# Patient Record
Sex: Male | Born: 1960 | Race: White | Hispanic: No | Marital: Married | Smoking: Never smoker
Health system: Southern US, Community
[De-identification: ages and names within clinical notes are randomized; demographics above are authoritative.]

## PROBLEM LIST (undated history)

## (undated) DIAGNOSIS — F32A Depression, unspecified: Secondary | ICD-10-CM

## (undated) DIAGNOSIS — R0609 Other forms of dyspnea: Secondary | ICD-10-CM

## (undated) DIAGNOSIS — R0989 Other specified symptoms and signs involving the circulatory and respiratory systems: Secondary | ICD-10-CM

## (undated) DIAGNOSIS — F329 Major depressive disorder, single episode, unspecified: Secondary | ICD-10-CM

## (undated) DIAGNOSIS — K219 Gastro-esophageal reflux disease without esophagitis: Secondary | ICD-10-CM

## (undated) HISTORY — PX: OTHER SURGICAL HISTORY: SHX169

## (undated) HISTORY — DX: Gastro-esophageal reflux disease without esophagitis: K21.9

## (undated) HISTORY — DX: Other specified symptoms and signs involving the circulatory and respiratory systems: R09.89

## (undated) HISTORY — PX: VASECTOMY: SHX75

## (undated) HISTORY — DX: Depression, unspecified: F32.A

## (undated) HISTORY — DX: Other specified symptoms and signs involving the circulatory and respiratory systems: R06.09

## (undated) HISTORY — DX: Major depressive disorder, single episode, unspecified: F32.9

---

## 2003-03-11 ENCOUNTER — Ambulatory Visit (HOSPITAL_BASED_OUTPATIENT_CLINIC_OR_DEPARTMENT_OTHER): Admission: RE | Admit: 2003-03-11 | Discharge: 2003-03-11 | Payer: Self-pay | Admitting: Emergency Medicine

## 2011-04-16 ENCOUNTER — Telehealth: Payer: Self-pay

## 2011-04-16 NOTE — Telephone Encounter (Signed)
Talked with pt and told him he is due for re-check. He had not planned to RTC until July, but will come in sooner if Dr Cleta Alberts needs him to. Dr Cleta Alberts, do you want pt to RTC bf RFing Concerta and Ritalin, or want to just give him 1 mos? Please advise

## 2011-04-16 NOTE — Telephone Encounter (Signed)
.  [facility]     PT REQUESTING REFILL ON BOTH [facility]    BEST PHONE [phone]

## 2011-04-17 MED ORDER — METHYLPHENIDATE HCL 10 MG PO TABS: 10.0000 mg | ORAL_TABLET | Freq: Two times a day (BID) | ORAL | Status: AC

## 2011-04-17 MED ORDER — METHYLPHENIDATE HCL ER (OSM) 36 MG PO TBCR: 36.0000 mg | EXTENDED_RELEASE_TABLET | ORAL | Status: AC

## 2011-04-17 NOTE — Telephone Encounter (Signed)
Rxs printed , ready for pick up

## 2011-04-17 NOTE — Telephone Encounter (Signed)
[facility] RXS ready to pick up

## 2011-04-17 NOTE — Telephone Encounter (Signed)
See Dr Ellis Parents OK for 2 more mos

## 2011-04-17 NOTE — Telephone Encounter (Signed)
Okay to give patient 2 months of medication . The patient can see me at 104/102 to give further medications.

## 2011-07-11 ENCOUNTER — Ambulatory Visit (INDEPENDENT_AMBULATORY_CARE_PROVIDER_SITE_OTHER): Payer: Managed Care, Other (non HMO) | Admitting: Family Medicine

## 2011-07-11 VITALS — BP 120/72 | HR 52 | Temp 97.7°F | Resp 18 | Ht 69.0 in | Wt 213.0 lb

## 2011-07-11 DIAGNOSIS — R05 Cough: Secondary | ICD-10-CM

## 2011-07-11 DIAGNOSIS — R0609 Other forms of dyspnea: Secondary | ICD-10-CM

## 2011-07-11 DIAGNOSIS — R0600 Dyspnea, unspecified: Secondary | ICD-10-CM

## 2011-07-11 DIAGNOSIS — R059 Cough, unspecified: Secondary | ICD-10-CM

## 2011-07-11 MED ORDER — ALBUTEROL SULFATE (2.5 MG/3ML) 0.083% IN NEBU: 2.5000 mg | INHALATION_SOLUTION | Freq: Four times a day (QID) | RESPIRATORY_TRACT | Status: DC | PRN

## 2011-07-11 MED ORDER — ALBUTEROL SULFATE HFA 108 (90 BASE) MCG/ACT IN AERS: 2.0000 | INHALATION_SPRAY | Freq: Four times a day (QID) | RESPIRATORY_TRACT | Status: DC | PRN

## 2011-07-11 NOTE — Patient Instructions (Addendum)
No restrictions at this time.  Use the albuterol inhaler 2 inhalations prior to physical activity.   We will let you know when the stress test is scheduled for.

## 2011-07-11 NOTE — Progress Notes (Signed)
Subjective: 51 year old male with history of working out by bicycle riding and running. He's been doing that for a long time. He now notices that when he works out hard, especially with running, he has nonproductive cough develops. He doesn't feel like he moves air quite well. There is just a little tight sensation when he breathes. He gets his with a running that he does with his bicycling. He does not smoke. He has no history of asthma. He has no history of heart disease. He gets regular physical exam from dr. Cleta Alberts. Lipids are good except for a borderline LDL.  Objective: No acute distress. Throat clear. Neck supple without nodes. Chest clear. Heart regular without murmurs. Abdomen benign.  Assessment: Cough and chest tightness with with exercise  Plan: EKG normal Peak flow test was normal with a predicted 585. He did between 575 and 600. Will give trial of albuterol. Also will make referral for a stress test.  Advise loss of a few pounds.

## 2011-07-30 ENCOUNTER — Encounter: Payer: Self-pay | Admitting: Family Medicine

## 2011-08-09 ENCOUNTER — Ambulatory Visit: Payer: Self-pay | Admitting: Cardiology

## 2011-09-18 ENCOUNTER — Encounter: Payer: Self-pay | Admitting: Cardiology

## 2011-09-18 ENCOUNTER — Ambulatory Visit (INDEPENDENT_AMBULATORY_CARE_PROVIDER_SITE_OTHER): Payer: Managed Care, Other (non HMO) | Admitting: Cardiology

## 2011-09-18 VITALS — BP 118/80 | HR 52 | Ht 70.0 in | Wt 215.0 lb

## 2011-09-18 DIAGNOSIS — R0989 Other specified symptoms and signs involving the circulatory and respiratory systems: Secondary | ICD-10-CM

## 2011-09-18 LAB — BRAIN NATRIURETIC PEPTIDE: Pro B Natriuretic peptide (BNP): 12 pg/mL (ref 0.0–100.0)

## 2011-09-18 NOTE — Progress Notes (Signed)
HPI The patient presents for evaluation of chest discomfort. He has no prior cardiac history though there is somewhat of a family history of early heart disease. He has been noticing chest tightness off and on daily for some time. It is getting more noticeable. He feels his heart beating strong with this and when he lays down at night. He thinks it might skip but this is not a predominant complaint. He's not having any presyncope or syncope. He says the chest tightness is just there and not made worse by activity. He doesn't do anything to try to take it away but works through it. He does exercise and can ride a bike.  He might get a little more short of breath he starts exercising but he works through this and any chest tightness that he might have. He doesn't have any radiation to his jaw or to his arms. He doesn't describe any PND or orthopnea. He does have a stress at work. For  No Known Allergies  Current Outpatient Prescriptions  Medication Sig Dispense Refill  . albuterol (PROVENTIL HFA;VENTOLIN HFA) 108 (90 BASE) MCG/ACT inhaler Inhale 2 puffs into the lungs every 6 (six) hours as needed for wheezing.  1 Inhaler  2    Past Medical History  Diagnosis Date  . Other dyspnea and respiratory abnormality   . Cough   . Chest tightness     No past surgical history on file.  No family history on file.  History   Social History  . Marital Status: Married    Spouse Name: N/A    Number of Children: N/A  . Years of Education: N/A   Occupational History  . Not on file.   Social History Main Topics  . Smoking status: Former Games developer  . Smokeless tobacco: Not on file  . Alcohol Use: Not on file  . Drug Use: Not on file  . Sexually Active: Not on file   Other Topics Concern  . Not on file   Social History Narrative  . No narrative on file    ROS:  As stated in the HPI and negative for all other systems.  PHYSICAL EXAM BP 118/80  Pulse 52  Ht 5\' 10"  (1.778 m)  Wt 215 lb  (97.523 kg)  BMI 30.85 kg/m2 GENERAL:  Well appearing HEENT:  Pupils equal round and reactive, fundi not visualized, oral mucosa unremarkable NECK:  No jugular venous distention, waveform within normal limits, carotid upstroke brisk and symmetric, no bruits, no thyromegaly LYMPHATICS:  No cervical, inguinal adenopathy LUNGS:  Clear to auscultation bilaterally BACK:  No CVA tenderness CHEST:  Unremarkable HEART:  PMI not displaced or sustained,S1 and S2 within normal limits, no S3, no S4, no clicks, no rubs, no murmurs ABD:  Flat, positive bowel sounds normal in frequency in pitch, no bruits, no rebound, no guarding, no midline pulsatile mass, no hepatomegaly, no splenomegaly EXT:  2 plus pulses throughout, no edema, no cyanosis no clubbing SKIN:  No rashes no nodules NEURO:  Cranial nerves II through XII grossly intact, motor grossly intact throughout [facility]:  Cognitively intact, oriented to person place and time   EKG:  Sinus bradycardia, rate 38, axis within normal limits, intervals within normal limits, no acute ST-T wave changes 07/11/11  ASSESSMENT AND PLAN  Chest pain -  I will bring the patient back for a POET (Plain Old Exercise Test). This will allow me to screen for obstructive coronary disease, risk stratify and very importantly provide a prescription  for exercise.  Bradycardia - To evaluate his bradycardia and the sensation of a strong heartbeat at night he'll have a 48-hour Holter monitor. I'll also check a BNP level as there is some dyspnea with exertion. However, he is a stress test monitor and BNP are normal no further cardiovascular testing will be plan.  Anxiety - He was tearful in the office today which is not apparently his baseline. However, we had a long talk about a llack of joy that he perceives and other depressive symptoms.  I will refer him to our Behavioral Health division.  I will also discuss with DAUB, Stan Head, MD

## 2011-09-18 NOTE — Patient Instructions (Addendum)
The current medical regimen is effective;  continue present plan and medications.  Your physician has requested that you have an exercise tolerance test. For further information please visit [email or web address] Please also follow instruction sheet, as given.  Your physician has recommended that you wear a holter monitor. Holter monitors are medical devices that record the heart's electrical activity. Doctors most often use these monitors to diagnose arrhythmias. Arrhythmias are problems with the speed or rhythm of the heartbeat. The monitor is a small, portable device. You can wear one while you do your normal daily activities. This is usually used to diagnose what is causing palpitations/syncope (passing out).  You will be contacted by Dr Teresita Madura office.

## 2011-09-20 ENCOUNTER — Ambulatory Visit (INDEPENDENT_AMBULATORY_CARE_PROVIDER_SITE_OTHER): Payer: Managed Care, Other (non HMO) | Admitting: Psychology

## 2011-09-20 DIAGNOSIS — F331 Major depressive disorder, recurrent, moderate: Secondary | ICD-10-CM

## 2011-09-27 ENCOUNTER — Ambulatory Visit (INDEPENDENT_AMBULATORY_CARE_PROVIDER_SITE_OTHER): Payer: Managed Care, Other (non HMO) | Admitting: Psychology

## 2011-09-27 ENCOUNTER — Ambulatory Visit: Payer: Managed Care, Other (non HMO) | Admitting: Psychology

## 2011-09-27 DIAGNOSIS — F4323 Adjustment disorder with mixed anxiety and depressed mood: Secondary | ICD-10-CM

## 2011-09-28 ENCOUNTER — Ambulatory Visit (INDEPENDENT_AMBULATORY_CARE_PROVIDER_SITE_OTHER): Payer: Managed Care, Other (non HMO) | Admitting: Emergency Medicine

## 2011-09-28 VITALS — BP 126/86 | HR 51 | Temp 97.8°F | Resp 16 | Ht 70.5 in | Wt 211.0 lb

## 2011-09-28 DIAGNOSIS — F329 Major depressive disorder, single episode, unspecified: Secondary | ICD-10-CM

## 2011-09-28 MED ORDER — ESCITALOPRAM OXALATE 10 MG PO TABS: 10.0000 mg | ORAL_TABLET | Freq: Every day | ORAL | Status: DC

## 2011-09-28 NOTE — Progress Notes (Signed)
  Subjective:    Patient ID: Matthew Henson, male    DOB: 07/04/1960, 51 y.o.   MRN: [id number]  HPI patient presents for followup. He has been thoroughly evaluated by the cardiologist and found not to have any evidence of heart disease. He went to the psychologist for evaluation of depression and she thought he was significantly depressed. He is here to get some help for this problem. He states he exercises regular he does not use drugs or drink. His wife has been concerned about him and their relationship has been difficult due to his depression. He has 2 brothers who are in good health. He lost his job about a week ago and feels his depression had a lot to do with that.    Review of Systems     Objective:   Physical Exam Patient is tearful but in no acute distress. Neck is supple. Chest is clear to auscultation and percussion. Heart is regular rate no murmurs abdomen soft nontender extremities without edema.       Assessment & Plan:  Patient here with  significant depression. We'll try Lexapro 10 one a day with the possibility of adding Wellbutrin in the future. we'll recheck in one month and see how he is doing .

## 2011-10-04 ENCOUNTER — Encounter: Payer: Managed Care, Other (non HMO) | Admitting: Physician Assistant

## 2011-10-09 ENCOUNTER — Encounter: Payer: Self-pay | Admitting: Emergency Medicine

## 2011-10-09 ENCOUNTER — Ambulatory Visit (INDEPENDENT_AMBULATORY_CARE_PROVIDER_SITE_OTHER): Payer: Managed Care, Other (non HMO) | Admitting: Emergency Medicine

## 2011-10-09 VITALS — BP 116/82 | HR 52 | Temp 98.5°F | Resp 16 | Ht 69.0 in | Wt 212.0 lb

## 2011-10-09 DIAGNOSIS — R05 Cough: Secondary | ICD-10-CM

## 2011-10-09 DIAGNOSIS — Z139 Encounter for screening, unspecified: Secondary | ICD-10-CM

## 2011-10-09 DIAGNOSIS — R0989 Other specified symptoms and signs involving the circulatory and respiratory systems: Secondary | ICD-10-CM

## 2011-10-09 DIAGNOSIS — F329 Major depressive disorder, single episode, unspecified: Secondary | ICD-10-CM

## 2011-10-09 DIAGNOSIS — J45990 Exercise induced bronchospasm: Secondary | ICD-10-CM

## 2011-10-09 LAB — IFOBT (OCCULT BLOOD): IFOBT: NEGATIVE

## 2011-10-09 MED ORDER — ALBUTEROL SULFATE HFA 108 (90 BASE) MCG/ACT IN AERS: 2.0000 | INHALATION_SPRAY | Freq: Four times a day (QID) | RESPIRATORY_TRACT | Status: AC | PRN

## 2011-10-09 MED ORDER — ESCITALOPRAM OXALATE 10 MG PO TABS: 10.0000 mg | ORAL_TABLET | Freq: Every day | ORAL | Status: DC

## 2011-10-09 NOTE — Progress Notes (Signed)
  Subjective:    Patient ID: Matthew Henson, male    DOB: 09-29-1960, 51 y.o.   MRN: [id number]  HPI    Review of Systems  Constitutional: Negative.   HENT: Negative.   Eyes: Negative.   Respiratory: Negative.   Cardiovascular: Negative.   Gastrointestinal: Negative.   Genitourinary: Negative.   Musculoskeletal: Negative.   Skin: Negative.   Neurological: Negative.   Hematological: Negative.   Psychiatric/Behavioral: Negative.        Objective:   Physical Exam HEENT exam is unremarkable. His neck is supple without carotid bruits. Thyroid is not enlarged chest is clear to auscultation and percussion. Heart is regular rate and rhythm without murmurs rubs or gallops. Abdomen is soft liver and spleen are not enlarged there are no areas of tenderness. GU reveals a normal male testicles descended no hernias palpable. Rectal exam reveals normal prostate no rectal masses palpable  Results for orders placed in visit on 10/09/11  IFOBT (OCCULT BLOOD)      Component Value Range   IFOBT Negative          Assessment & Plan:  Patient has responded very well starting on Lexapro. He is starting a new job  the end of this month. He is agreeable to come in the first of the year once his insurance takes effect and have more comprehensive blood work done. His cardiac workup was negative for heart disease.

## 2011-10-11 ENCOUNTER — Encounter: Payer: Self-pay | Admitting: Family Medicine

## 2011-11-10 ENCOUNTER — Telehealth: Payer: Self-pay

## 2011-11-10 NOTE — Telephone Encounter (Signed)
Pt is requesting an rx to be called in for generic cipro Pharmacy: [facility]

## 2011-11-10 NOTE — Telephone Encounter (Signed)
Called patient to find out why he was requesting Cipro. He states he is having a flare of diverticulitis. Hasnt had a flare in about 3 years. Was here just a couple weeks ago for physical with Dr. Cleta Alberts. He is having lower L abd pain and he knows this is a diverticulitis flare. He is going out of town tomorrow. Please advise.

## 2011-11-11 ENCOUNTER — Telehealth: Payer: Self-pay

## 2011-11-11 NOTE — Telephone Encounter (Signed)
PATIENT CALLED ABOUT RX REFILL ON CIPRO AND IF IT WAS REFILLED. IT STILL NEEDS APPROVAL FROM A PHYSICIAN. PLEASE CALL BACK IMMEDIATELY PATIENT NEEDS TO GO OUT OF TOWN TODAY? FOR WORK. THANK YOU!

## 2011-11-11 NOTE — Telephone Encounter (Signed)
Called patient to advise we can not do this without office visit.

## 2011-11-11 NOTE — Telephone Encounter (Signed)
No, not appropriate to fill Cipro without an office visit,patient advised.

## 2011-11-11 NOTE — Telephone Encounter (Signed)
Needs ov

## 2011-11-15 ENCOUNTER — Ambulatory Visit (INDEPENDENT_AMBULATORY_CARE_PROVIDER_SITE_OTHER): Payer: Managed Care, Other (non HMO) | Admitting: Emergency Medicine

## 2011-11-15 VITALS — BP 126/86 | HR 98 | Temp 98.0°F | Resp 16 | Ht 70.5 in | Wt 208.8 lb

## 2011-11-15 DIAGNOSIS — K5792 Diverticulitis of intestine, part unspecified, without perforation or abscess without bleeding: Secondary | ICD-10-CM

## 2011-11-15 DIAGNOSIS — K5732 Diverticulitis of large intestine without perforation or abscess without bleeding: Secondary | ICD-10-CM

## 2011-11-15 MED ORDER — AMOXICILLIN-POT CLAVULANATE ER 1000-62.5 MG PO TB12: 2.0000 | ORAL_TABLET | Freq: Two times a day (BID) | ORAL | Status: DC

## 2011-11-15 MED ORDER — METRONIDAZOLE 500 MG PO TABS: 500.0000 mg | ORAL_TABLET | Freq: Two times a day (BID) | ORAL | Status: DC

## 2011-11-15 MED ORDER — SULFAMETHOXAZOLE-TRIMETHOPRIM 800-160 MG PO TABS: 1.0000 | ORAL_TABLET | Freq: Two times a day (BID) | ORAL | Status: DC

## 2011-11-15 NOTE — Progress Notes (Signed)
[facility] [address] [place] [address] [phone]  Date:  11/15/2011   Name:  Matthew Henson   DOB:  August 11, 1960   MRN:  [id number]  PCP:  Lucilla Edin, MD    Chief Complaint: Diverticulitis   History of Present Illness:  Matthew Henson is a 51 y.o. very pleasant male patient who presents with the following:  Third episode of diverticulitis in patient with diverticulosis.  Twice diagnosed by CT.  Has LLQ pain intermittently for past two weeks.  Decreased fiber and increased fluids in diet and persists.  Had fever two nights ago.  No nausea or vomiting, diarrhea or stool change.  No blood in stool or black stools.  Was out of town for several days.  Patient Active Problem List  Diagnosis  . Depression  . Asthma, exercise induced    Past Medical History  Diagnosis Date  . Other dyspnea and respiratory abnormality     Presumptive diagnosis of exercised induced asthma  . Depression   . GERD (gastroesophageal reflux disease)     Past Surgical History  Procedure Date  . None   . Vasectomy     History  Substance Use Topics  . Smoking status: Former Games developer  . Smokeless tobacco: Not on file   Comment: Light smoker years ago.  . Alcohol Use: Not on file    Family History  Problem Relation Age of Onset  . Cancer Father     Throat  . Heart failure Brother 41    Substance abuse    No Known Allergies  Medication list has been reviewed and updated.  Current Outpatient Prescriptions on File Prior to Visit  Medication Sig Dispense Refill  . albuterol (PROVENTIL HFA;VENTOLIN HFA) 108 (90 BASE) MCG/ACT inhaler Inhale 2 puffs into the lungs every 6 (six) hours as needed for wheezing.  3 Inhaler  3  . escitalopram (LEXAPRO) 10 MG tablet Take 1 tablet (10 mg total) by mouth daily.  90 tablet  3    Review of Systems:  As per HPI, otherwise negative.    Physical Examination: Filed Vitals:   11/15/11 1453  BP: 126/86  Pulse: 98  Temp: 98 F  (36.7 C)  Resp: 16   Filed Vitals:   11/15/11 1453  Height: 5' 10.5" (1.791 m)  Weight: 208 lb 12.8 oz (94.711 kg)   Body mass index is 29.54 kg/(m^2). Ideal Body Weight: Weight in (lb) to have BMI = 25: 176.4   GEN: WDWN, NAD, Non-toxic, A & O x 3 HEENT: Atraumatic, Normocephalic. Neck supple. No masses, No LAD. Ears and Nose: No external deformity. CV: RRR, No M/G/R. No JVD. No thrill. No extra heart sounds. PULM: CTA B, no wheezes, crackles, rhonchi. No retractions. No resp. distress. No accessory muscle use. ABD: S, mild LLQ tenderness, ND, +BS. No rebound. No HSM. EXTR: No c/c/e NEURO Normal gait.  PSYCH: Normally interactive. Conversant. Not depressed or anxious appearing.  Calm demeanor.    Assessment and Plan: Diverticulitis Septra ds Flagyl Follow up for new or worsened symptoms  Carmelina Dane, MD  I have reviewed and agree with documentation. Robert P. Merla Riches, M.D.

## 2012-09-09 ENCOUNTER — Other Ambulatory Visit: Payer: Self-pay | Admitting: Emergency Medicine

## 2012-09-12 ENCOUNTER — Other Ambulatory Visit: Payer: Self-pay | Admitting: Emergency Medicine

## 2012-09-12 ENCOUNTER — Telehealth: Payer: Self-pay

## 2012-09-12 ENCOUNTER — Other Ambulatory Visit: Payer: Self-pay | Admitting: Radiology

## 2012-09-12 MED ORDER — ESCITALOPRAM OXALATE 10 MG PO TABS: 10.0000 mg | ORAL_TABLET | Freq: Every day | ORAL | Status: DC

## 2012-09-12 NOTE — Addendum Note (Signed)
Addended byCaffie Damme on: 09/12/2012 04:10 PM   Modules accepted: Orders

## 2012-09-12 NOTE — Telephone Encounter (Signed)
Pt is calling to find out why his rx for generic lexapro is not written for 90 days. Best# [phone]

## 2012-09-12 NOTE — Addendum Note (Signed)
Addended by: Godfrey Pick on: 09/12/2012 04:14 PM   Modules accepted: Orders

## 2012-09-12 NOTE — Telephone Encounter (Signed)
A 30 day supply was just sent 09/09/12.  I have sent an additional #60 lexapro to the pharmacy

## 2012-09-12 NOTE — Telephone Encounter (Signed)
Because he is due for office visit. I have called him to advise.

## 2012-09-12 NOTE — Telephone Encounter (Signed)
Patient indicated he does have physical scheduled, pended 90 day supply, can we send this for him?

## 2012-11-04 ENCOUNTER — Ambulatory Visit (INDEPENDENT_AMBULATORY_CARE_PROVIDER_SITE_OTHER): Payer: BC Managed Care – PPO | Admitting: Emergency Medicine

## 2012-11-04 ENCOUNTER — Telehealth: Payer: Self-pay

## 2012-11-04 ENCOUNTER — Encounter: Payer: Self-pay | Admitting: Emergency Medicine

## 2012-11-04 VITALS — BP 100/67 | HR 50 | Temp 97.8°F | Resp 16 | Ht 70.0 in | Wt 221.0 lb

## 2012-11-04 DIAGNOSIS — F329 Major depressive disorder, single episode, unspecified: Secondary | ICD-10-CM

## 2012-11-04 DIAGNOSIS — Z0000 Encounter for general adult medical examination without abnormal findings: Secondary | ICD-10-CM

## 2012-11-04 DIAGNOSIS — R635 Abnormal weight gain: Secondary | ICD-10-CM

## 2012-11-04 LAB — POCT URINALYSIS DIPSTICK
Glucose, UA: NEGATIVE
Leukocytes, UA: NEGATIVE
Nitrite, UA: NEGATIVE
Urobilinogen, UA: 0.2

## 2012-11-04 LAB — CBC WITH DIFFERENTIAL/PLATELET
Eosinophils Absolute: 0.1 10*3/uL (ref 0.0–0.7)
Lymphs Abs: 1.8 10*3/uL (ref 0.7–4.0)
MCH: 31.2 pg (ref 26.0–34.0)
Neutrophils Relative %: 50 % (ref 43–77)
Platelets: 237 10*3/uL (ref 150–400)
RBC: 5.16 MIL/uL (ref 4.22–5.81)
WBC: 5 10*3/uL (ref 4.0–10.5)

## 2012-11-04 LAB — IFOBT (OCCULT BLOOD): IFOBT: NEGATIVE

## 2012-11-04 MED ORDER — BUPROPION HCL ER (XL) 150 MG PO TB24: ORAL_TABLET | ORAL | Status: DC

## 2012-11-04 MED ORDER — ESCITALOPRAM OXALATE 10 MG PO TABS: 10.0000 mg | ORAL_TABLET | Freq: Every day | ORAL | Status: DC

## 2012-11-04 NOTE — Progress Notes (Signed)
@[email or web address] @  Patient ID: Matthew Henson MRN: [id number], DOB: 06-Oct-1960 52 y.o. Date of Encounter: 11/04/2012, 11:05 AM  Primary Physician: Lucilla Edin, MD  Chief Complaint: Physical (CPE)  HPI: 52 y.o. y/o male with history noted below here for CPE.  Doing well. No issues/complaints.  Review of Systems:  Consitutional: No fever, chills, fatigue, night sweats, lymphadenopathy, or weight changes. Eyes: No visual changes, eye redness, or discharge. ENT/Mouth: Ears: No otalgia, tinnitus, hearing loss, discharge. Nose: No congestion, rhinorrhea, sinus pain, or epistaxis. Throat: No sore throat, post nasal drip, or teeth pain. Cardiovascular: No CP, palpitations, diaphoresis, DOE, edema, orthopnea, PND. He underwent a thorough cardiovascular evaluation about a year ago with negative findings. At that time he is having difficulty with wheezing which may have been stress related. Respiratory: No cough, hemoptysis, SOB, or wheezing. Gastrointestinal: No anorexia, dysphagia, reflux, pain, nausea, vomiting, hematemesis, diarrhea, constipation, BRBPR, or melena. Genitourinary: No dysuria, frequency, urgency, hematuria, incontinence, nocturia, decreased urinary stream, discharge, impotence, or testicular pain/masses. Musculoskeletal: No decreased ROM, myalgias, stiffness, joint swelling, or weakness. Skin: No rash, erythema, lesion changes, pain, warmth, jaundice, or pruritis. Neurological: No headache, dizziness, syncope, seizures, tremors, memory loss, coordination problems, or paresthesias. Psychological: No anxiety, he's been very depressed recently over the loss of his mom he has been sleeping more having more crying spells.  , hallucinations, SI/HI. Endocrine: No fatigue, polydipsia, polyphagia, polyuria, or known diabetes. All other systems were reviewed and are otherwise negative.  Past Medical History  Diagnosis Date  . Other dyspnea and respiratory abnormality     Presumptive diagnosis of  exercised induced asthma  . Depression   . GERD (gastroesophageal reflux disease)      Past Surgical History  Procedure Laterality Date  . None    . Vasectomy      Home Meds:  Prior to Admission medications   Medication Sig Start Date End Date Taking? Authorizing Provider  escitalopram (LEXAPRO) 10 MG tablet Take 1 tablet (10 mg total) by mouth daily. Needs office visit 09/12/12  Yes Godfrey Pick, PA-C    Allergies: No Known Allergies  History   Social History  . Marital Status: Married    Spouse Name: N/A    Number of Children: 2  . Years of Education: N/A   Occupational History  .     Social History Main Topics  . Smoking status: Former Games developer  . Smokeless tobacco: Not on file     Comment: Light smoker years ago.  . Alcohol Use: 3.6 oz/week    6 Cans of beer per week  . Drug Use: No  . Sexual Activity: Yes   Other Topics Concern  . Not on file   Social History Narrative   Lives at home with wife and 2 children.   Stressful job.      Family History  Problem Relation Age of Onset  . Cancer Father     Throat  . Heart failure Brother 41    Substance abuse    Physical Exam:  Blood pressure 100/67, pulse 50, temperature 97.8 F (36.6 C), resp. rate 16, height 5\' 10"  (1.778 m), weight 221 lb (100.245 kg).  General: Well developed, well nourished, in no acute distress. HEENT: Normocephalic, atraumatic. Conjunctiva pink, sclera non-icteric. Pupils 2 mm constricting to 1 mm, round, regular, and equally reactive to light and accomodation. EOMI. Internal auditory canal clear. TMs with good cone of light and without pathology. Nasal mucosa pink. Nares are without discharge. No sinus tenderness.  Oral mucosa pink. Dentition . Pharynx without exudate.   Neck: Supple. Trachea midline. No thyromegaly. Full ROM. No lymphadenopathy. Lungs: Clear to auscultation bilaterally without wheezes, rales, or rhonchi. Breathing is of normal effort and unlabored. Cardiovascular:  RRR with S1 S2. No murmurs, rubs, or gallops appreciated. Distal pulses 2+ symmetrically. No carotid or abdominal bruits.  Abdomen: Soft, non-tender, non-distended with normoactive bowel sounds. No hepatosplenomegaly or masses. No rebound/guarding. No CVA tenderness. Without hernias.  Rectal: No external hemorrhoids or fissures. Rectal vault without masses.   Genitourinary:   circumcised male. No penile lesions. Testes descended bilaterally, and smooth without tenderness or masses.  Musculoskeletal: Full range of motion and 5/5 strength throughout. Without swelling, atrophy, tenderness, crepitus, or warmth. Extremities without clubbing, cyanosis, or edema. Calves supple. Skin: Warm and moist without erythema, ecchymosis, wounds, or rash. Neuro: A+Ox3. CN II-XII grossly intact. Moves all extremities spontaneously. Full sensation throughout. Normal gait. DTR 2+ throughout upper and lower extremities. Finger to nose intact. Psych:  Responds to questions appropriately with a normal affect.   Studies: CBC, CMET, Lipid, PSA, TSH,     Assessment/Plan:  52 y.o. y/o here for physical examination. He has been depressed recently although the loss of his mother. she died earlier this year from cancer. We'll try Wellbutrin to take in the morning reevaluation with me in about 2 months. He will continue on Lexapro to take at night. I offered him grief counseling. He will let me know if he needs to see the counselor to  -  Signed, Earl Lites, MD 11/04/2012 11:05 AM

## 2012-11-04 NOTE — Telephone Encounter (Signed)
[facility] to report wellbutrin XL 150 mg is on B.O. And don't know when they will get it in. [facility] that pt also uses has plenty of stock. Called pt and will send in 30 day supply to [facility] and notified [facility] to put Rx on hold.

## 2012-11-05 LAB — LIPID PANEL
Cholesterol: 185 mg/dL (ref 0–200)
Total CHOL/HDL Ratio: 3.8 Ratio
Triglycerides: 75 mg/dL (ref <150)
VLDL: 15 mg/dL (ref 0–40)

## 2012-11-05 LAB — COMPREHENSIVE METABOLIC PANEL
ALT: 17 U/L (ref 0–53)
AST: 24 U/L (ref 0–37)
Albumin: 4.6 g/dL (ref 3.5–5.2)
CO2: 26 mEq/L (ref 19–32)
Calcium: 9.5 mg/dL (ref 8.4–10.5)
Chloride: 104 mEq/L (ref 96–112)
Creat: 1.21 mg/dL (ref 0.50–1.35)
Glucose, Bld: 80 mg/dL (ref 70–99)
Potassium: 4.3 mEq/L (ref 3.5–5.3)
Total Bilirubin: 0.9 mg/dL (ref 0.3–1.2)
Total Protein: 7.5 g/dL (ref 6.0–8.3)

## 2012-11-06 ENCOUNTER — Telehealth: Payer: Self-pay

## 2012-11-06 NOTE — Telephone Encounter (Signed)
Pt had missed call from yesterday and he is calling back to speak to someone Call back number is [phone]

## 2012-11-06 NOTE — Telephone Encounter (Signed)
Spoke with pt about labs already.

## 2013-01-06 ENCOUNTER — Encounter: Payer: Self-pay | Admitting: Emergency Medicine

## 2013-01-06 ENCOUNTER — Ambulatory Visit (INDEPENDENT_AMBULATORY_CARE_PROVIDER_SITE_OTHER): Payer: BC Managed Care – PPO | Admitting: Emergency Medicine

## 2013-01-06 VITALS — BP 128/86 | HR 66 | Temp 98.0°F | Resp 16 | Ht 69.0 in | Wt 220.2 lb

## 2013-01-06 DIAGNOSIS — F329 Major depressive disorder, single episode, unspecified: Secondary | ICD-10-CM

## 2013-01-06 MED ORDER — ESCITALOPRAM OXALATE 10 MG PO TABS: 10.0000 mg | ORAL_TABLET | Freq: Every day | ORAL | Status: DC

## 2013-01-06 MED ORDER — BUPROPION HCL ER (XL) 150 MG PO TB24: ORAL_TABLET | ORAL | Status: DC

## 2013-01-06 NOTE — Progress Notes (Signed)
   Subjective:    Patient ID: Matthew Henson, male    DOB: [birth year], 52 y.o.   MRN: [id number]  HPI patient here to followup on his depression. When he stopped his Lexapro and went up on his Wellbutrin to 300 today he started to feel bad again. His depression relates to the loss of his mother one year ago he felt he did better when he took that well viewed from 150 in the morning and Lexapro 10 at night .    Review of Systems     Objective:   Physical Exam HEENT exam is unremarkable. Neck is supple. Chest clear        Assessment & Plan:  Will treat his depression with Wellbutrin XL 150 in the morning and resume his Lexapro 10 at night.

## 2013-11-17 ENCOUNTER — Ambulatory Visit (INDEPENDENT_AMBULATORY_CARE_PROVIDER_SITE_OTHER): Payer: PRIVATE HEALTH INSURANCE | Admitting: Emergency Medicine

## 2013-11-17 ENCOUNTER — Encounter: Payer: Self-pay | Admitting: Emergency Medicine

## 2013-11-17 VITALS — BP 123/75 | HR 56 | Temp 97.6°F | Resp 16 | Ht 69.75 in | Wt 210.8 lb

## 2013-11-17 DIAGNOSIS — R001 Bradycardia, unspecified: Secondary | ICD-10-CM

## 2013-11-17 DIAGNOSIS — Z136 Encounter for screening for cardiovascular disorders: Secondary | ICD-10-CM

## 2013-11-17 DIAGNOSIS — Z125 Encounter for screening for malignant neoplasm of prostate: Secondary | ICD-10-CM

## 2013-11-17 DIAGNOSIS — Z0000 Encounter for general adult medical examination without abnormal findings: Secondary | ICD-10-CM

## 2013-11-17 DIAGNOSIS — K5732 Diverticulitis of large intestine without perforation or abscess without bleeding: Secondary | ICD-10-CM

## 2013-11-17 DIAGNOSIS — F329 Major depressive disorder, single episode, unspecified: Secondary | ICD-10-CM

## 2013-11-17 DIAGNOSIS — Z1389 Encounter for screening for other disorder: Secondary | ICD-10-CM

## 2013-11-17 DIAGNOSIS — Z1329 Encounter for screening for other suspected endocrine disorder: Secondary | ICD-10-CM

## 2013-11-17 DIAGNOSIS — Z13220 Encounter for screening for lipoid disorders: Secondary | ICD-10-CM

## 2013-11-17 DIAGNOSIS — F32A Depression, unspecified: Secondary | ICD-10-CM

## 2013-11-17 LAB — CBC WITH DIFFERENTIAL/PLATELET
BASOS PCT: 1 % (ref 0–1)
Basophils Absolute: 0 10*3/uL (ref 0.0–0.1)
Eosinophils Absolute: 0.1 10*3/uL (ref 0.0–0.7)
Eosinophils Relative: 3 % (ref 0–5)
HCT: 41 % (ref 39.0–52.0)
Hemoglobin: 14.7 g/dL (ref 13.0–17.0)
LYMPHS ABS: 1.5 10*3/uL (ref 0.7–4.0)
Lymphocytes Relative: 35 % (ref 12–46)
MCH: 30.4 pg (ref 26.0–34.0)
MCHC: 35.9 g/dL (ref 30.0–36.0)
MCV: 84.9 fL (ref 78.0–100.0)
Monocytes Absolute: 0.5 10*3/uL (ref 0.1–1.0)
Monocytes Relative: 11 % (ref 3–12)
Neutro Abs: 2.1 10*3/uL (ref 1.7–7.7)
Neutrophils Relative %: 50 % (ref 43–77)
PLATELETS: 241 10*3/uL (ref 150–400)
RBC: 4.83 MIL/uL (ref 4.22–5.81)
RDW: 12.8 % (ref 11.5–15.5)
WBC: 4.2 10*3/uL (ref 4.0–10.5)

## 2013-11-17 LAB — POCT URINALYSIS DIPSTICK
Bilirubin, UA: NEGATIVE
Glucose, UA: NEGATIVE
Ketones, UA: NEGATIVE
LEUKOCYTES UA: NEGATIVE
Nitrite, UA: NEGATIVE
PROTEIN UA: NEGATIVE
RBC UA: NEGATIVE
Spec Grav, UA: 1.01
Urobilinogen, UA: 0.2
pH, UA: 6

## 2013-11-17 LAB — COMPLETE METABOLIC PANEL WITH GFR
ALK PHOS: 52 U/L (ref 39–117)
ALT: 15 U/L (ref 0–53)
AST: 21 U/L (ref 0–37)
Albumin: 4 g/dL (ref 3.5–5.2)
BILIRUBIN TOTAL: 0.5 mg/dL (ref 0.2–1.2)
BUN: 20 mg/dL (ref 6–23)
CO2: 25 mEq/L (ref 19–32)
Calcium: 9 mg/dL (ref 8.4–10.5)
Chloride: 106 mEq/L (ref 96–112)
Creat: 1.23 mg/dL (ref 0.50–1.35)
GFR, Est African American: 78 mL/min
GFR, Est Non African American: 68 mL/min
Glucose, Bld: 95 mg/dL (ref 70–99)
Potassium: 4.3 mEq/L (ref 3.5–5.3)
SODIUM: 139 meq/L (ref 135–145)
TOTAL PROTEIN: 6.5 g/dL (ref 6.0–8.3)

## 2013-11-17 LAB — LIPID PANEL
Cholesterol: 151 mg/dL (ref 0–200)
HDL: 47 mg/dL (ref >39)
LDL Cholesterol: 90 mg/dL (ref 0–99)
Total CHOL/HDL Ratio: 3.2 Ratio
Triglycerides: 72 mg/dL (ref <150)
VLDL: 14 mg/dL (ref 0–40)

## 2013-11-17 LAB — TSH: TSH: 1.758 u[IU]/mL (ref 0.350–4.500)

## 2013-11-17 MED ORDER — ESCITALOPRAM OXALATE 5 MG PO TABS: 5.0000 mg | ORAL_TABLET | Freq: Every day | ORAL | Status: DC

## 2013-11-17 MED ORDER — AMOXICILLIN-POT CLAVULANATE 875-125 MG PO TABS: 1.0000 | ORAL_TABLET | Freq: Two times a day (BID) | ORAL | Status: AC

## 2013-11-17 MED ORDER — BUPROPION HCL ER (XL) 150 MG PO TB24: ORAL_TABLET | ORAL | Status: DC

## 2013-11-17 MED ORDER — AMOXICILLIN-POT CLAVULANATE 875-125 MG PO TABS: 1.0000 | ORAL_TABLET | Freq: Two times a day (BID) | ORAL | Status: DC

## 2013-11-17 NOTE — Progress Notes (Signed)
   Subjective:    Patient ID: Matthew Henson, male    DOB: 18-Apr-1960, 53 y.o.   MRN: [id number]  HPI    Review of Systems  Constitutional: Negative.   HENT: Negative.   Eyes: Negative.   Respiratory: Negative.   Cardiovascular: Negative.   Gastrointestinal: Negative.   Endocrine: Negative.   Genitourinary: Negative.   Musculoskeletal: Negative.   Skin: Negative.   Allergic/Immunologic: Positive for environmental allergies.  Neurological: Negative.   Hematological: Negative.   Psychiatric/Behavioral: Negative.        Objective:   Physical Exam        Assessment & Plan:

## 2013-11-17 NOTE — Progress Notes (Signed)
Subjective:    Patient ID: Matthew Henson, male    DOB: [birth year], 53 y.o.   MRN: [id number]  HPI  HPI: 53 y.o. y/o male with history of noted below here, for CPE.  He has 2 concerns today... He has noticed progressive fatigue and dizziness.  The dizziness occurs when he stands up to fast.  One occasion he had to grab something to steady himself.  It last for seconds, and then resolves.  Last week, he was cutting down a tree at his home, and had to stop due to lightheadedness and fatigue.  He is sleeping about 8hrs a night.  He denies any fever, coughing, bleeding, blood in stool, bruising.  He attributes the dizziness to the medicaiton.    Takes 1 in the morning.  And escitalopram in the evening.  Standing would cause dizziness, it has mostly resolved, but not completely.   He is also complaining of abdominal pain at [facility].  He states this has gone on for about 2-3 weeks.  He says it hurts with movement and rotating at the torso.  He has had similar symptoms with bouts of diverticulitis at least twice.  Normally he changes his diet to decrease his fiber intake and go on fluids.  He will also fast, which normally resolves the symptoms.  But this time, he says these dietary changes are not helping much.  He denies fever, blood in stool, diarrhea, or constipation.  He denies any change in his daily BM.  Last BM was yesterday.      Past Medical History   Diagnosis  Date   .  Other dyspnea and respiratory abnormality      Presumptive diagnosis of exercised induced asthma   .  Depression    .  GERD (gastroesophageal reflux disease)   Diverticulosis   Past Surgical History   Procedure  Laterality  Date   .  None     .  Vasectomy     Home Meds:  Prior to Admission medications   Medication  Sig  Start Date  End Date  Taking?  Authorizing Provider   escitalopram (LEXAPRO) 10 MG tablet  Take 1 tablet (10 mg total) by mouth daily. Needs office visit  09/12/12   Yes  Theda Sers, PA-C   Allergies: No  Known Allergies  History    Social History   .  Marital Status:  Married     Spouse Name:  N/A     Number of Children:  2   .  Years of Education:  N/A    Occupational History   .      Social History Main Topics   .  Smoking status:  Former Research scientist (life sciences)   .  Smokeless tobacco:  Not on file      Comment: Light smoker years ago.   .  Alcohol Use:  3.6 oz/week      6 Cans of beer per week   .  Drug Use:  No   .  Sexual Activity:  Yes    Other Topics  Concern   .  Not on file    Social History Narrative    Lives at home with wife and 2 children. Stressful job. 19, 14   Family History   Problem  Relation  Age of Onset   .  Cancer  Father      Throat   .  Heart failure  Brother  41     Substance abuse  Review of Systems  Constitutional: Positive for fatigue. Negative for fever.  HENT: Negative for ear pain and sore throat.   Eyes: Negative for photophobia and pain.  Respiratory: Negative for cough, chest tightness and shortness of breath.   Cardiovascular: Negative for chest pain, palpitations and leg swelling.  Gastrointestinal: Positive for abdominal pain. Negative for nausea, vomiting, diarrhea, constipation and blood in stool.  Genitourinary: Negative for urgency, frequency and flank pain.  Musculoskeletal: Negative for joint swelling.       Objective:   Physical Exam  Constitutional: He appears well-developed and well-nourished. No distress.  BP 104/60  Pulse 43  Temp(Src) 97.6 F (36.4 C) (Oral)  Resp 16  Ht 5' 9.75" (1.772 m)  Wt 210 lb 12.8 oz (95.618 kg)  BMI 30.45 kg/m2  SpO2 97% Orthostats:  Supine 117/75 HR 47,  Sitting 117/76 HR 49,  Standing 123/75 HR 56 Post one minute of aerobic movement 140/80 HR 72    Cardiovascular: Regular rhythm and intact distal pulses.  Bradycardia present.  Exam reveals no gallop, no distant heart sounds and no friction rub.   Pulmonary/Chest: Effort normal and breath sounds normal. No apnea. No respiratory distress.  He has no wheezes.  Abdominal: There is tenderness in the suprapubic area and left lower quadrant. There is no CVA tenderness, no tenderness at McBurney's point and negative Murphy's sign.  Musculoskeletal: Normal range of motion.  Lymphadenopathy:       Head (right side): No submental, no submandibular, no preauricular, no posterior auricular and no occipital adenopathy present.       Head (left side): No submental, no submandibular, no preauricular, no posterior auricular and no occipital adenopathy present.       Right cervical: No posterior cervical adenopathy present.      Left cervical: No posterior cervical adenopathy present.       Right: No supraclavicular adenopathy present.       Left: No supraclavicular adenopathy present.  Skin: Skin is warm, dry and intact.    Results for orders placed in visit on 11/17/13  POCT URINALYSIS DIPSTICK      Result Value Ref Range   Color, UA yellow     Clarity, UA clear     Glucose, UA neg     Bilirubin, UA neg     Ketones, UA neg     Spec Grav, UA 1.010     Blood, UA neg     pH, UA 6.0     Protein, UA neg     Urobilinogen, UA 0.2     Nitrite, UA neg     Leukocytes, UA Negative       EKG: Sinus Bradycardia, No ST abnormalities,     Assessment & Plan:  Bradycardia- This has been worked up by cardiology a few years ago. He is not orthostatic which lowers my suspicious that this abdominal pain is associated with the bradycardia.  HR successfully picked up with aerobic movement.   This could a side effect response to the start of his escitalopram.  But we will order TSH, CBC, and CMP to see if any metabolic, anemia, or hormone level abnormality is at play.   -Decreasing the escitalopram to 5mg , he will monitor his HR at home and follow up in 2 weeks regarding the results. -TSH -CBC with Differential -COMPLETE METABOLIC PANEL WITH GFR -EKG 12-Lead  Diverticulitis This is a recurring complication to his diverticulosis.  He has had the  same symptoms in times past with  this disease.  Because of this, I am not going to order another imaging, but rather treat the presumed diverticulitis and follow up.  At this time, he is willng to see a GI doctor, but he is uninterested in any surgical treatments to remedy this recurrent problem at this time.    -Ordered amoxicillin-clavulanate (AUGMENTIN) 875-125 MG per tablet -He will contact us if his symptoms worsen, or if they do not resolve in 2 weeks.    Depression -escitalopram (LEXAPRO) 5 MG tablet: Due to the bradycardia, we are decreasing the lexapro, to assess whether this may be a culprit in some fatigue and dizziness.  Patient will contact us in 2 weeks for any change in mood and behavior due to the decrease in the lexapro. -Refill buPROPion (WELLBUTRIN XL) 150 MG 24 hr tablet printout for mail order  -Medications printed for mail orde  Screening for nephropathy -POCT urinalysis dipstick-negative   Ivar Drape, PA-C [facility] 10/20/201510:08 AM

## 2013-11-18 LAB — PSA: PSA: 2.17 ng/mL (ref <=4.00)

## 2013-11-22 ENCOUNTER — Encounter: Payer: Self-pay | Admitting: Emergency Medicine

## 2013-11-24 ENCOUNTER — Ambulatory Visit: Payer: PRIVATE HEALTH INSURANCE | Admitting: Emergency Medicine

## 2013-12-01 ENCOUNTER — Ambulatory Visit: Payer: PRIVATE HEALTH INSURANCE | Admitting: Emergency Medicine

## 2013-12-03 ENCOUNTER — Other Ambulatory Visit: Payer: Self-pay | Admitting: Emergency Medicine

## 2013-12-04 ENCOUNTER — Encounter: Payer: Self-pay | Admitting: Emergency Medicine

## 2013-12-29 ENCOUNTER — Ambulatory Visit (INDEPENDENT_AMBULATORY_CARE_PROVIDER_SITE_OTHER): Payer: PRIVATE HEALTH INSURANCE | Admitting: Emergency Medicine

## 2013-12-29 ENCOUNTER — Encounter: Payer: Self-pay | Admitting: Emergency Medicine

## 2013-12-29 VITALS — BP 104/78 | HR 50 | Temp 98.2°F | Resp 16 | Ht 69.75 in | Wt 208.0 lb

## 2013-12-29 DIAGNOSIS — F329 Major depressive disorder, single episode, unspecified: Secondary | ICD-10-CM

## 2013-12-29 DIAGNOSIS — R001 Bradycardia, unspecified: Secondary | ICD-10-CM

## 2013-12-29 DIAGNOSIS — F32A Depression, unspecified: Secondary | ICD-10-CM

## 2013-12-29 DIAGNOSIS — K5732 Diverticulitis of large intestine without perforation or abscess without bleeding: Secondary | ICD-10-CM

## 2013-12-29 NOTE — Patient Instructions (Signed)
Diverticulosis Diverticulosis is the condition that develops when small pouches (diverticula) form in the wall of your colon. Your colon, or large intestine, is where water is absorbed and stool is formed. The pouches form when the inside layer of your colon pushes through weak spots in the outer layers of your colon. CAUSES  No one knows exactly what causes diverticulosis. RISK FACTORS  Being older than 50. Your risk for this condition increases with age. Diverticulosis is rare in people younger than 40 years. By age 80, almost everyone has it.  Eating a low-fiber diet.  Being frequently constipated.  Being overweight.  Not getting enough exercise.  Smoking.  Taking over-the-counter pain medicines, like aspirin and ibuprofen. SYMPTOMS  Most people with diverticulosis do not have symptoms. DIAGNOSIS  Because diverticulosis often has no symptoms, health care providers often discover the condition during an exam for other colon problems. In many cases, a health care provider will diagnose diverticulosis while using a flexible scope to examine the colon (colonoscopy). TREATMENT  If you have never developed an infection related to diverticulosis, you may not need treatment. If you have had an infection before, treatment may include:  Eating more fruits, vegetables, and grains.  Taking a fiber supplement.  Taking a live bacteria supplement (probiotic).  Taking medicine to relax your colon. HOME CARE INSTRUCTIONS   Drink at least 6-8 glasses of water each day to prevent constipation.  Try not to strain when you have a bowel movement.  Keep all follow-up appointments. If you have had an infection before:  Increase the fiber in your diet as directed by your health care provider or dietitian.  Take a dietary fiber supplement if your health care provider approves.  Only take medicines as directed by your health care provider. SEEK MEDICAL CARE IF:   You have abdominal  pain.  You have bloating.  You have cramps.  You have not gone to the bathroom in 3 days. SEEK IMMEDIATE MEDICAL CARE IF:   Your pain gets worse.  Yourbloating becomes very bad.  You have a fever or chills, and your symptoms suddenly get worse.  You begin vomiting.  You have bowel movements that are bloody or black. MAKE SURE YOU:  Understand these instructions.  Will watch your condition.  Will get help right away if you are not doing well or get worse. Document Released: 10/13/2003 Document Revised: 01/20/2013 Document Reviewed: 12/10/2012 ExitCare Patient Information 2015 [employer]. This information is not intended to replace advice given to you by your health care provider. Make sure you discuss any questions you have with your health care provider.  

## 2013-12-29 NOTE — Progress Notes (Signed)
   Subjective:    Patient ID: Keyonta Barradas, male    DOB: Nov 20, 1960, 53 y.o.   MRN: [id number]  HPI  53 year old male is here today for a follow up following decreasing his lexapro to 5mg , to see if the heart rate is due to his bradycardia.  He reports that he may notice less grogginess with the mornings after this decrease.  Since the medication change, he denies any dizziness or fatiguing episodes.    One week ago had a head on concussion.  Head-to-head.  Blacked out for a second.  Saw splotches.  Put ice on it.  Took advil.  No neck pain.  Bruises forehead.  Stomach but threw up and diarrhea that night, but family members had it.  Feel less fatigued possibly in the morning.  No dizziness.   -three days, where as took less time    Past Medical History  Diagnosis Date  . Other dyspnea and respiratory abnormality     Presumptive diagnosis of exercised induced asthma  . Depression   . GERD (gastroesophageal reflux disease)    Family History  Problem Relation Age of Onset  . Cancer Father     Throat  . Heart failure Brother 41    Substance abuse  . Cancer Mother    .  Review of Systems     Objective:   Physical Exam  Constitutional: He is oriented to person, place, and time. He appears well-developed and well-nourished.  HENT:  Head: Normocephalic and atraumatic.  Eyes: Pupils are equal, round, and reactive to light. Right eye exhibits no discharge. Left eye exhibits no discharge.  Cardiovascular: Regular rhythm and normal heart sounds.  Bradycardia present.  Exam reveals no gallop and no friction rub.   No murmur heard. Pulses:      Dorsalis pedis pulses are 2+ on the right side, and 2+ on the left side.  Pulmonary/Chest: Breath sounds normal. No accessory muscle usage. No apnea. No respiratory distress. He has no decreased breath sounds. He has no wheezes. He has no rhonchi.  Neurological: He is alert and oriented to person, place, and time.  Skin: Skin is warm and dry.    Psychiatric: He has a normal mood and affect. His behavior is normal. Judgment and thought content normal.          Assessment & Plan:  53 year old male with PMH listed above, is here today for follow up of bradycardia and diverticulosis inflammation.  Bradycardia -Stable.  Cardiology work up done last year.  The bradycardia appears to be asymptomatic.  He will contact us for any alarming changes.    Diverticulitis of large intestine without perforation or abscess without bleeding -Stable.  Appears to have a smoldering diverticulosis/itis.  He will continue to make necessary dietary changes and restrictions presumptively for symptoms, and contact when symptoms do not resolve with mods.    Depression Stable, will continue the lexapro at 5mg .  He will contact for depression decline.  Ivar Drape, PA-C [facility] 12/1/20154:20 PM

## 2014-06-05 ENCOUNTER — Telehealth: Payer: Self-pay

## 2014-06-05 NOTE — Telephone Encounter (Signed)
PA is needed for escitalopram to be taken along with buproprion. Completed on covermymeds. Pending.

## 2014-06-07 NOTE — Telephone Encounter (Signed)
Pharm called to check status of PA because pt was yelling at her this morning. She has given pt a weeks' worth of medication so he will not run out. I reported PA is pending and will let her know when we get decision.

## 2014-06-08 NOTE — Telephone Encounter (Signed)
[employer] faxed form with exactly same ?s that I answered on covermymeds (which they attached to the form they sent). Completed/answered ?s again and faxed in.

## 2014-06-18 NOTE — Telephone Encounter (Signed)
Over ride was put into place so that pt can take both meds concurrently. Notified pharmacy.

## 2014-08-30 ENCOUNTER — Ambulatory Visit (INDEPENDENT_AMBULATORY_CARE_PROVIDER_SITE_OTHER): Payer: PRIVATE HEALTH INSURANCE | Admitting: Emergency Medicine

## 2014-08-30 ENCOUNTER — Ambulatory Visit (INDEPENDENT_AMBULATORY_CARE_PROVIDER_SITE_OTHER): Payer: PRIVATE HEALTH INSURANCE

## 2014-08-30 VITALS — BP 130/80 | HR 61 | Temp 97.8°F | Resp 16 | Ht 70.0 in | Wt 221.4 lb

## 2014-08-30 DIAGNOSIS — R059 Cough, unspecified: Secondary | ICD-10-CM

## 2014-08-30 DIAGNOSIS — J029 Acute pharyngitis, unspecified: Secondary | ICD-10-CM | POA: Diagnosis not present

## 2014-08-30 DIAGNOSIS — R05 Cough: Secondary | ICD-10-CM | POA: Diagnosis not present

## 2014-08-30 DIAGNOSIS — R5382 Chronic fatigue, unspecified: Secondary | ICD-10-CM | POA: Diagnosis not present

## 2014-08-30 LAB — POCT RAPID STREP A (OFFICE): Rapid Strep A Screen: NEGATIVE

## 2014-08-30 MED ORDER — OMEPRAZOLE 40 MG PO CPDR: 40.0000 mg | DELAYED_RELEASE_CAPSULE | Freq: Every day | ORAL | Status: DC

## 2014-08-30 MED ORDER — BUPROPION HCL ER (XL) 300 MG PO TB24: 300.0000 mg | ORAL_TABLET | Freq: Every day | ORAL | Status: DC

## 2014-08-30 NOTE — Progress Notes (Signed)
Chief Complaint:  Chief Complaint  Patient presents with  . Cough    Usually when exercising  . Sore Throat    x 6 weeks  . Medication Consult    Pt states pharmacy expressed issues with him taking Wellbutrin and Lexapro together    HPI: Matthew Henson is a 54 y.o. male who reports to [facility] today complaining of sore throat for the last 6 weeks. He has not had any respiratory distress. He has had a dry cough and was concerned this was related to reflux. He did start on Prilosec 2 weeks ago and has not noted improvement. Problem #2 Patient also requesting evaluation of his current anti-presence. He continues to be fatigued in the afternoon. He had a bad experience with a sleep study which was done previously. He is currently off of Lexapro and on Wellbutrin XL 150 an day.  Past Medical History  Diagnosis Date  . Other dyspnea and respiratory abnormality     Presumptive diagnosis of exercised induced asthma  . Depression   . GERD (gastroesophageal reflux disease)    Past Surgical History  Procedure Laterality Date  . None    . Vasectomy     History   Social History  . Marital Status: Married    Spouse Name: N/A  . Number of Children: 2  . Years of Education: N/A   Occupational History  .     Social History Main Topics  . Smoking status: Former Research scientist (life sciences)  . Smokeless tobacco: Not on file     Comment: Light smoker years ago.  . Alcohol Use: 3.6 oz/week    6 Cans of beer per week  . Drug Use: No  . Sexual Activity: Yes   Other Topics Concern  . None   Social History Narrative   Lives at home with wife and 2 children.   Stressful job.     Family History  Problem Relation Age of Onset  . Cancer Father     Throat  . Heart failure Brother 41    Substance abuse  . Cancer Mother    No Known Allergies Prior to Admission medications   Medication Sig Start Date End Date Taking? Authorizing Provider  buPROPion (WELLBUTRIN XL) 150 MG 24 hr tablet Take 1 tablet (150 mg  total) by mouth daily. 12/04/13  Yes Darlyne Russian, MD  omeprazole (PRILOSEC) 20 MG capsule Take 20 mg by mouth daily.   Yes Historical Provider, MD  escitalopram (LEXAPRO) 5 MG tablet Take 1 tablet (5 mg total) by mouth daily. Patient not taking: Reported on 08/30/2014 11/17/13   Darlyne Russian, MD     ROS: The patient denies fevers, chills, night sweats, unintentional weight loss, chest pain, palpitations, wheezing, dyspnea on exertion, nausea, vomiting, abdominal pain, dysuria, hematuria, melena, numbness, weakness, or tingling.  All other systems have been reviewed and were otherwise negative with the exception of those mentioned in the HPI and as above.    PHYSICAL EXAM: Filed Vitals:   08/30/14 0839  BP: 130/80  Pulse: 61  Temp: 97.8 F (36.6 C)  Resp: 16   Body mass index is 31.77 kg/(m^2).   General: Alert, no acute distress HEENT:  Normocephalic, atraumatic, oropharynx patent. Eye: Juliette Mangle [facility] Cardiovascular:  Regular rate and rhythm, no rubs murmurs or gallops.  No Carotid bruits, radial pulse intact. No pedal edema.  Respiratory: Clear to auscultation bilaterally.  No wheezes, rales, or rhonchi.  No cyanosis, no use of  accessory musculature Abdominal: No organomegaly, abdomen is soft and non-tender, positive bowel sounds.  No masses. Musculoskeletal: Gait intact. No edema, tenderness Skin: No rashes. Neurologic: Facial musculature symmetric. Psychiatric: Patient acts appropriately throughout our interaction. Lymphatic: No cervical or submandibular lymphadenopathy Genitourinary/Anorectal: No acute findings    LABS: Results for orders placed or performed in visit on 11/17/13  CBC with Differential  Result Value Ref Range   WBC 4.2 4.0 - 10.5 K/uL   RBC 4.83 4.22 - 5.81 MIL/uL   Hemoglobin 14.7 13.0 - 17.0 g/dL   HCT 41.0 39.0 - 52.0 %   MCV 84.9 78.0 - 100.0 fL   MCH 30.4 26.0 - 34.0 pg   MCHC 35.9 30.0 - 36.0 g/dL   RDW 12.8 11.5 - 15.5 %   Platelets 241 150  - 400 K/uL   Neutrophils Relative % 50 43 - 77 %   Neutro Abs 2.1 1.7 - 7.7 K/uL   Lymphocytes Relative 35 12 - 46 %   Lymphs Abs 1.5 0.7 - 4.0 K/uL   Monocytes Relative 11 3 - 12 %   Monocytes Absolute 0.5 0.1 - 1.0 K/uL   Eosinophils Relative 3 0 - 5 %   Eosinophils Absolute 0.1 0.0 - 0.7 K/uL   Basophils Relative 1 0 - 1 %   Basophils Absolute 0.0 0.0 - 0.1 K/uL   Smear Review Criteria for review not met   COMPLETE METABOLIC PANEL WITH GFR  Result Value Ref Range   Sodium 139 135 - 145 mEq/L   Potassium 4.3 3.5 - 5.3 mEq/L   Chloride 106 96 - 112 mEq/L   CO2 25 19 - 32 mEq/L   Glucose, Bld 95 70 - 99 mg/dL   BUN 20 6 - 23 mg/dL   Creat 1.23 0.50 - 1.35 mg/dL   Total Bilirubin 0.5 0.2 - 1.2 mg/dL   Alkaline Phosphatase 52 39 - 117 U/L   AST 21 0 - 37 U/L   ALT 15 0 - 53 U/L   Total Protein 6.5 6.0 - 8.3 g/dL   Albumin 4.0 3.5 - 5.2 g/dL   Calcium 9.0 8.4 - 10.5 mg/dL   GFR, Est African American 78 mL/min   GFR, Est Non African American 68 mL/min  TSH  Result Value Ref Range   TSH 1.758 0.350 - 4.500 uIU/mL  Lipid panel  Result Value Ref Range   Cholesterol 151 0 - 200 mg/dL   Triglycerides 72 <150 mg/dL   HDL 47 >39 mg/dL   Total CHOL/HDL Ratio 3.2 Ratio   VLDL 14 0 - 40 mg/dL   LDL Cholesterol 90 0 - 99 mg/dL  PSA  Result Value Ref Range   PSA 2.17 <=4.00 ng/mL  POCT urinalysis dipstick  Result Value Ref Range   Color, UA yellow    Clarity, UA clear    Glucose, UA neg    Bilirubin, UA neg    Ketones, UA neg    Spec Grav, UA 1.010    Blood, UA neg    pH, UA 6.0    Protein, UA neg    Urobilinogen, UA 0.2    Nitrite, UA neg    Leukocytes, UA Negative      EKG/XRAY:   Primary read interpreted by Dr. Everlene Farrier at [facility]. Chest x-ray has some minimal increased markings in the base seen on the lateral   ASSESSMENT/PLAN: Patient placed on omeprazole 40 mg 1 a day. His Lexapro will be stopped. He will be on Wellbutrin  at an increased dose of 300 mg daily. Sleep  study ordered.   Gross sideeffects, risk and benefits, and alternatives of medications d/w patient. Patient is aware that all medications have potential sideeffects and we are unable to predict every sideeffect or drug-drug interaction that may occur.  Arlyss Queen MD 08/30/2014 9:15 AM

## 2014-08-31 ENCOUNTER — Encounter: Payer: Self-pay | Admitting: Emergency Medicine

## 2014-09-01 ENCOUNTER — Other Ambulatory Visit: Payer: Self-pay

## 2014-09-01 MED ORDER — BUPROPION HCL ER (XL) 300 MG PO TB24: 300.0000 mg | ORAL_TABLET | Freq: Every day | ORAL | Status: DC

## 2014-09-06 ENCOUNTER — Telehealth: Payer: Self-pay

## 2014-09-06 NOTE — Telephone Encounter (Signed)
PA needed for buproprion ER 300. Pt was taken off of lexapro and his bupropion increased d/t ins not wanting to cover both. PA should not be needed for just the bupropion? Called pharm to verify if needed. Pharm advised that Rx does NOT need a PA.

## 2014-11-02 ENCOUNTER — Encounter: Payer: Self-pay | Admitting: Emergency Medicine

## 2014-11-18 ENCOUNTER — Encounter: Payer: PRIVATE HEALTH INSURANCE | Admitting: Emergency Medicine

## 2014-12-21 ENCOUNTER — Ambulatory Visit (INDEPENDENT_AMBULATORY_CARE_PROVIDER_SITE_OTHER): Payer: Managed Care, Other (non HMO) | Admitting: Emergency Medicine

## 2014-12-21 ENCOUNTER — Encounter: Payer: Self-pay | Admitting: Emergency Medicine

## 2014-12-21 VITALS — BP 125/75 | HR 57 | Temp 98.3°F | Resp 16 | Ht 70.0 in | Wt 212.0 lb

## 2014-12-21 DIAGNOSIS — F439 Reaction to severe stress, unspecified: Secondary | ICD-10-CM

## 2014-12-21 DIAGNOSIS — Z0000 Encounter for general adult medical examination without abnormal findings: Secondary | ICD-10-CM | POA: Diagnosis not present

## 2014-12-21 DIAGNOSIS — Z658 Other specified problems related to psychosocial circumstances: Secondary | ICD-10-CM | POA: Diagnosis not present

## 2014-12-21 DIAGNOSIS — K5730 Diverticulosis of large intestine without perforation or abscess without bleeding: Secondary | ICD-10-CM

## 2014-12-21 DIAGNOSIS — Z114 Encounter for screening for human immunodeficiency virus [HIV]: Secondary | ICD-10-CM | POA: Diagnosis not present

## 2014-12-21 DIAGNOSIS — Z125 Encounter for screening for malignant neoplasm of prostate: Secondary | ICD-10-CM

## 2014-12-21 DIAGNOSIS — N419 Inflammatory disease of prostate, unspecified: Secondary | ICD-10-CM | POA: Diagnosis not present

## 2014-12-21 DIAGNOSIS — Z1159 Encounter for screening for other viral diseases: Secondary | ICD-10-CM

## 2014-12-21 DIAGNOSIS — Z13220 Encounter for screening for lipoid disorders: Secondary | ICD-10-CM | POA: Diagnosis not present

## 2014-12-21 DIAGNOSIS — Z1329 Encounter for screening for other suspected endocrine disorder: Secondary | ICD-10-CM | POA: Diagnosis not present

## 2014-12-21 LAB — COMPLETE METABOLIC PANEL WITH GFR
ALT: 24 U/L (ref 9–46)
AST: 29 U/L (ref 10–35)
Albumin: 4.3 g/dL (ref 3.6–5.1)
Alkaline Phosphatase: 52 U/L (ref 40–115)
BILIRUBIN TOTAL: 0.9 mg/dL (ref 0.2–1.2)
BUN: 17 mg/dL (ref 7–25)
CO2: 27 mmol/L (ref 20–31)
CREATININE: 0.94 mg/dL (ref 0.70–1.33)
Calcium: 9.3 mg/dL (ref 8.6–10.3)
Chloride: 103 mmol/L (ref 98–110)
GFR, Est Non African American: >89 mL/min (ref >=60)
GLUCOSE: 82 mg/dL (ref 65–99)
Potassium: 4.3 mmol/L (ref 3.5–5.3)
Sodium: 138 mmol/L (ref 135–146)
TOTAL PROTEIN: 7.5 g/dL (ref 6.1–8.1)

## 2014-12-21 LAB — CBC WITH DIFFERENTIAL/PLATELET
Basophils Absolute: 0 10*3/uL (ref 0.0–0.1)
Basophils Relative: 0 % (ref 0–1)
Eosinophils Absolute: 0.2 10*3/uL (ref 0.0–0.7)
Eosinophils Relative: 3 % (ref 0–5)
HEMATOCRIT: 44.4 % (ref 39.0–52.0)
HEMOGLOBIN: 15.8 g/dL (ref 13.0–17.0)
LYMPHS ABS: 1.9 10*3/uL (ref 0.7–4.0)
LYMPHS PCT: 34 % (ref 12–46)
MCH: 30.3 pg (ref 26.0–34.0)
MCHC: 35.6 g/dL (ref 30.0–36.0)
MCV: 85.2 fL (ref 78.0–100.0)
MONOS PCT: 10 % (ref 3–12)
MPV: 9.2 fL (ref 8.6–12.4)
Monocytes Absolute: 0.6 10*3/uL (ref 0.1–1.0)
NEUTROS ABS: 3 10*3/uL (ref 1.7–7.7)
NEUTROS PCT: 53 % (ref 43–77)
Platelets: 209 10*3/uL (ref 150–400)
RBC: 5.21 MIL/uL (ref 4.22–5.81)
RDW: 13 % (ref 11.5–15.5)
WBC: 5.7 10*3/uL (ref 4.0–10.5)

## 2014-12-21 LAB — POC MICROSCOPIC URINALYSIS (UMFC): MUCUS RE: ABSENT

## 2014-12-21 LAB — IFOBT (OCCULT BLOOD): IMMUNOLOGICAL FECAL OCCULT BLOOD TEST: NEGATIVE

## 2014-12-21 LAB — POCT URINALYSIS DIP (MANUAL ENTRY)
Bilirubin, UA: NEGATIVE
Glucose, UA: NEGATIVE
Ketones, POC UA: NEGATIVE
Leukocytes, UA: NEGATIVE
Nitrite, UA: NEGATIVE
PH UA: 5.5
PROTEIN UA: NEGATIVE
SPEC GRAV UA: 1.02
UROBILINOGEN UA: 0.2

## 2014-12-21 LAB — LIPID PANEL
Cholesterol: 214 mg/dL — ABNORMAL HIGH (ref 125–200)
HDL: 49 mg/dL (ref >=40)
LDL CALC: 140 mg/dL — AB (ref <130)
TRIGLYCERIDES: 126 mg/dL (ref <150)
Total CHOL/HDL Ratio: 4.4 Ratio (ref <=5.0)
VLDL: 25 mg/dL (ref <30)

## 2014-12-21 LAB — TSH: TSH: 2.37 u[IU]/mL (ref 0.350–4.500)

## 2014-12-21 MED ORDER — ALPRAZOLAM 0.5 MG PO TABS: ORAL_TABLET | ORAL | Status: DC

## 2014-12-21 MED ORDER — AMOXICILLIN-POT CLAVULANATE 875-125 MG PO TABS: 1.0000 | ORAL_TABLET | Freq: Two times a day (BID) | ORAL | Status: DC

## 2014-12-21 NOTE — Progress Notes (Signed)
Patient ID: Matthew Henson, male   DOB: 1960-02-02, 54 y.o.   MRN: [id number]     This chart was scribed for Arlyss Queen, MD by Zola Button, Medical Scribe. This patient was seen in room 22 and the patient's care was started at 12:00 PM.   Chief Complaint:  Chief Complaint  Patient presents with  . Annual Exam    HPI: Matthew Henson is a 54 y.o. male with a history of depression and diverticulitis who reports to [facility] today for an annual exam. Patient has been doing well overall. He has been doing well with the increased dose of Wellbutrin, but sometimes has breakthrough symptoms such as increased heart rate. Work has always been stressful for him. His last physical was a year ago. He does exercise.  Patient had a colonoscopy due to diverticulitis; this was normal and he was told to repeat in 10 years.  Patient reports having difficulty urinating, noting that his urine stream has not been strong. He also reports having urinary urgency and lower abdominal pain/pressure in the mornings. The pain is improved after urination. Patient denies nocturia.  Patient states his GERD has resolved after 3 months of treatment.  His mother passed away from metastatic disease of unknown origin at the age of 32. His father, who had a history of alcohol use and smoking, passed away from throat cancer. His brother, who had a history of obesity, passed away from CHF.  He has two children of age 73 and 83.  Past Medical History  Diagnosis Date  . Other dyspnea and respiratory abnormality     Presumptive diagnosis of exercised induced asthma  . Depression   . GERD (gastroesophageal reflux disease)    Past Surgical History  Procedure Laterality Date  . None    . Vasectomy     Social History   Social History  . Marital Status: Married    Spouse Name: N/A  . Number of Children: 2  . Years of Education: N/A   Occupational History  .     Social History Main Topics  . Smoking status: Former Research scientist (life sciences)  .  Smokeless tobacco: None     Comment: Light smoker years ago.  . Alcohol Use: 3.6 oz/week    6 Cans of beer per week  . Drug Use: No  . Sexual Activity: Yes   Other Topics Concern  . None   Social History Narrative   Lives at home with wife and 2 children.   Stressful job.     Family History  Problem Relation Age of Onset  . Cancer Father     Throat  . Heart failure Brother 41    Substance abuse  . Cancer Mother    No Known Allergies Prior to Admission medications   Medication Sig Start Date End Date Taking? Authorizing Provider  buPROPion (WELLBUTRIN XL) 300 MG 24 hr tablet Take 1 tablet (300 mg total) by mouth daily. 09/01/14  Yes Darlyne Russian, MD     ROS: The patient denies fevers, chills, night sweats, unintentional weight loss, chest pain, wheezing, dyspnea on exertion, nausea, vomiting, hematuria, melena, numbness, weakness, or tingling.   All other systems have been reviewed and were otherwise negative with the exception of those mentioned in the HPI and as above.    PHYSICAL EXAM: Filed Vitals:   12/21/14 1149  BP: 125/75  Pulse: 57  Temp: 98.3 F (36.8 C)  Resp: 16   Body mass index is 30.42 kg/(m^2).  General: Alert, no acute distress HEENT:  Normocephalic, atraumatic, oropharynx patent. Eye: Juliette Mangle [facility] Cardiovascular:  Regular rate and rhythm, no rubs murmurs or gallops.  No Carotid bruits, radial pulse intact. No pedal edema.  Respiratory: Clear to auscultation bilaterally.  No wheezes, rales, or rhonchi.  No cyanosis, no use of accessory musculature Abdominal: Minimal suprapubic tenderness. Musculoskeletal: Gait intact. No edema, tenderness Skin: No rashes. Neurologic: Facial musculature symmetric. Psychiatric: Patient acts appropriately throughout our interaction. Lymphatic: No cervical or submandibular lymphadenopathy Genitourinary: Prostate symmetrically enlarged. Smooth without nodules.   LABS:    EKG/XRAY:   Primary read interpreted  by Dr. Everlene Farrier at [facility].   ASSESSMENT/PLAN:  we'll treat with Augmentin for 10 days to cover for prostatitis. Blood work ordered as well as urine. If he does not respond to the Augmentin he will let me know. He declined to have a tetanus shot or flu shot.I personally performed the services described in this documentation, which was scribed in my presence. The recorded information has been reviewed and is accurate.  By signing my name below, I, Zola Button, attest that this documentation has been prepared under the direction and in the presence of Arlyss Queen, MD.  Electronically Signed: Zola Button, Medical Scribe. 12/21/2014. 12:00 PM.   Gross sideeffects, risk and benefits, and alternatives of medications d/w patient. Patient is aware that all medications have potential sideeffects and we are unable to predict every sideeffect or drug-drug interaction that may occur.  Arlyss Queen MD 12/21/2014 12:00 PM

## 2014-12-22 ENCOUNTER — Other Ambulatory Visit: Payer: Self-pay | Admitting: Emergency Medicine

## 2014-12-22 DIAGNOSIS — R972 Elevated prostate specific antigen [PSA]: Secondary | ICD-10-CM

## 2014-12-22 LAB — HEPATITIS C ANTIBODY: HCV AB: NEGATIVE

## 2014-12-22 LAB — HIV ANTIBODY (ROUTINE TESTING W REFLEX): HIV 1&2 Ab, 4th Generation: NONREACTIVE

## 2014-12-22 LAB — PSA: PSA: 2.73 ng/mL (ref <=4.00)

## 2015-02-07 ENCOUNTER — Encounter: Payer: Self-pay | Admitting: Emergency Medicine

## 2015-02-08 ENCOUNTER — Other Ambulatory Visit: Payer: Self-pay | Admitting: Emergency Medicine

## 2015-02-08 DIAGNOSIS — N400 Enlarged prostate without lower urinary tract symptoms: Secondary | ICD-10-CM

## 2015-02-08 MED ORDER — TAMSULOSIN HCL 0.4 MG PO CAPS: 0.4000 mg | ORAL_CAPSULE | Freq: Every day | ORAL | Status: DC

## 2015-07-08 DIAGNOSIS — R3911 Hesitancy of micturition: Secondary | ICD-10-CM | POA: Diagnosis not present

## 2015-07-08 DIAGNOSIS — N401 Enlarged prostate with lower urinary tract symptoms: Secondary | ICD-10-CM | POA: Diagnosis not present

## 2015-08-30 ENCOUNTER — Telehealth: Payer: Self-pay

## 2015-08-30 NOTE — Telephone Encounter (Signed)
Patient has schedule Annual Physical with Dr. Carlota Raspberry on 12/15/2015 @ 8:30 am..  Per patient MyChart message - I have a prescription for buprionHCL XL 300 that I have filled for last time so I will need to extend until at least November when my physical is scheduled. [facility]acy is [facility].   Phone number [phone]

## 2015-08-31 ENCOUNTER — Other Ambulatory Visit: Payer: Self-pay | Admitting: *Deleted

## 2015-08-31 MED ORDER — BUPROPION HCL ER (XL) 300 MG PO TB24: 300.0000 mg | ORAL_TABLET | Freq: Every day | ORAL | 0 refills | Status: DC

## 2015-09-25 ENCOUNTER — Other Ambulatory Visit: Payer: Self-pay | Admitting: Emergency Medicine

## 2015-10-17 DIAGNOSIS — N401 Enlarged prostate with lower urinary tract symptoms: Secondary | ICD-10-CM | POA: Diagnosis not present

## 2015-10-21 DIAGNOSIS — R350 Frequency of micturition: Secondary | ICD-10-CM | POA: Diagnosis not present

## 2015-10-21 DIAGNOSIS — N401 Enlarged prostate with lower urinary tract symptoms: Secondary | ICD-10-CM | POA: Diagnosis not present

## 2015-10-21 DIAGNOSIS — R102 Pelvic and perineal pain: Secondary | ICD-10-CM | POA: Diagnosis not present

## 2015-12-15 ENCOUNTER — Ambulatory Visit (INDEPENDENT_AMBULATORY_CARE_PROVIDER_SITE_OTHER): Payer: BLUE CROSS/BLUE SHIELD | Admitting: Family Medicine

## 2015-12-15 ENCOUNTER — Encounter: Payer: Self-pay | Admitting: Family Medicine

## 2015-12-15 ENCOUNTER — Telehealth: Payer: Self-pay | Admitting: *Deleted

## 2015-12-15 VITALS — BP 110/70 | HR 59 | Temp 97.8°F | Resp 16 | Ht 69.25 in | Wt 219.2 lb

## 2015-12-15 DIAGNOSIS — Z0000 Encounter for general adult medical examination without abnormal findings: Secondary | ICD-10-CM | POA: Diagnosis not present

## 2015-12-15 DIAGNOSIS — Z13220 Encounter for screening for lipoid disorders: Secondary | ICD-10-CM | POA: Diagnosis not present

## 2015-12-15 DIAGNOSIS — F418 Other specified anxiety disorders: Secondary | ICD-10-CM | POA: Diagnosis not present

## 2015-12-15 DIAGNOSIS — Z23 Encounter for immunization: Secondary | ICD-10-CM | POA: Diagnosis not present

## 2015-12-15 DIAGNOSIS — F439 Reaction to severe stress, unspecified: Secondary | ICD-10-CM | POA: Diagnosis not present

## 2015-12-15 LAB — LIPID PANEL
CHOL/HDL RATIO: 4.2 ratio (ref <5.0)
CHOLESTEROL: 184 mg/dL (ref <200)
HDL: 44 mg/dL (ref >40)
LDL Cholesterol: 118 mg/dL — ABNORMAL HIGH (ref <100)
TRIGLYCERIDES: 111 mg/dL (ref <150)
VLDL: 22 mg/dL (ref <30)

## 2015-12-15 LAB — COMPLETE METABOLIC PANEL WITH GFR
ALBUMIN: 4.3 g/dL (ref 3.6–5.1)
ALK PHOS: 58 U/L (ref 40–115)
ALT: 23 U/L (ref 9–46)
AST: 25 U/L (ref 10–35)
BUN: 19 mg/dL (ref 7–25)
CALCIUM: 9.1 mg/dL (ref 8.6–10.3)
CHLORIDE: 105 mmol/L (ref 98–110)
CO2: 27 mmol/L (ref 20–31)
CREATININE: 1.05 mg/dL (ref 0.70–1.33)
GFR, Est Non African American: 81 mL/min (ref >=60)
Glucose, Bld: 99 mg/dL (ref 65–99)
POTASSIUM: 4.6 mmol/L (ref 3.5–5.3)
Sodium: 140 mmol/L (ref 135–146)
Total Bilirubin: 0.5 mg/dL (ref 0.2–1.2)
Total Protein: 6.9 g/dL (ref 6.1–8.1)

## 2015-12-15 LAB — TSH: TSH: 2.02 mIU/L (ref 0.40–4.50)

## 2015-12-15 MED ORDER — ALPRAZOLAM 0.5 MG PO TABS: ORAL_TABLET | ORAL | 1 refills | Status: DC

## 2015-12-15 MED ORDER — FLUOXETINE HCL 20 MG PO TABS: 20.0000 mg | ORAL_TABLET | Freq: Every day | ORAL | 0 refills | Status: DC

## 2015-12-15 MED ORDER — FLUOXETINE HCL 20 MG PO TABS: 20.0000 mg | ORAL_TABLET | Freq: Every day | ORAL | 1 refills | Status: DC

## 2015-12-15 NOTE — Patient Instructions (Addendum)
Call one of the counselors to setup appointment.  Matthew Henson: [phone] [place]: [phone] Jackelyn Hoehn: [phone]  Stop the Wellbutrin, start Prozac one per day. Follow-up with me in the next 3-4 weeks to discuss medication further. Sooner if worse. As we discussed if you have any suicidal thoughts, be seen right away at the emergency room. I did go ahead and place a referral to psychiatry to discuss medications, but if you're doing well on the medications I prescribed, you can always cancel that appointment.  We can discuss the plan from the urologist further next visit.  If cholesterol is elevated, we can look into overall heart disease risk and decide on medication at that time.  Return to the clinic or go to the nearest emergency room if any of your symptoms worsen or new symptoms occur.   Keeping you healthy  Get these tests  Blood pressure- Have your blood pressure checked once a year by your healthcare provider.  Normal blood pressure is 120/80  Weight- Have your body mass index (BMI) calculated to screen for obesity.  BMI is a measure of body fat based on height and weight. You can also calculate your own BMI at [employer].[email or web address].  Cholesterol- Have your cholesterol checked every year.  Diabetes- Have your blood sugar checked regularly if you have high blood pressure, high cholesterol, have a family history of diabetes or if you are overweight.  Screening for Colon Cancer- Colonoscopy starting at age 24.  Screening may begin sooner depending on your family history and other health conditions. Follow up colonoscopy as directed by your Gastroenterologist.  Screening for Prostate Cancer- Both blood work (PSA) and a rectal exam help screen for Prostate Cancer.  Screening begins at age 43 with African-American men and at age 64 with Caucasian men.  Screening may begin sooner depending on your family history.  Take these medicines  Aspirin- One aspirin daily can help  prevent Heart disease and Stroke.  Flu shot- Every fall.  Tetanus- Every 10 years.  Zostavax- Once after the age of 19 to prevent Shingles.  Pneumonia shot- Once after the age of 1; if you are younger than 74, ask your healthcare provider if you need a Pneumonia shot.  Take these steps  Don't smoke- If you do smoke, talk to your doctor about quitting.  For tips on how to quit, go to www.smokefree.gov or call 1-800-QUIT-NOW.  Be physically active- Exercise 5 days a week for at least 30 minutes.  If you are not already physically active start slow and gradually work up to 30 minutes of moderate physical activity.  Examples of moderate activity include walking briskly, mowing the yard, dancing, swimming, bicycling, etc.  Eat a healthy diet- Eat a variety of healthy food such as fruits, vegetables, low fat milk, low fat cheese, yogurt, lean meant, poultry, fish, beans, tofu, etc. For more information go to www.thenutritionsource.org  Drink alcohol in moderation- Limit alcohol intake to less than two drinks a day. Never drink and drive.  Dentist- Brush and floss twice daily; visit your dentist twice a year.  Depression- Your emotional health is as important as your physical health. If you're feeling down, or losing interest in things you would normally enjoy please talk to your healthcare provider.  Eye exam- Visit your eye doctor every year.  Safe sex- If you may be exposed to a sexually transmitted infection, use a condom.  Seat belts- Seat belts can save your life; always wear one.  Smoke/Carbon Monoxide detectors-  These detectors need to be installed on the appropriate level of your home.  Replace batteries at least once a year.  Skin cancer- When out in the sun, cover up and use sunscreen 15 SPF or higher.  Violence- If anyone is threatening you, please tell your healthcare provider.  Living Will/ Health care power of attorney- Speak with your healthcare provider and  family. Stress and Stress Management Stress is a normal reaction to life events. It is what you feel when life demands more than you are used to or more than you can handle. Some stress can be useful. For example, the stress reaction can help you catch the last bus of the day, study for a test, or meet a deadline at work. But stress that occurs too often or for too long can cause problems. It can affect your emotional health and interfere with relationships and normal daily activities. Too much stress can weaken your immune system and increase your risk for physical illness. If you already have a medical problem, stress can make it worse. What are the causes? All sorts of life events may cause stress. An event that causes stress for one person may not be stressful for another person. Major life events commonly cause stress. These may be positive or negative. Examples include losing your job, moving into a new home, getting married, having a baby, or losing a loved one. Less obvious life events may also cause stress, especially if they occur day after day or in combination. Examples include working long hours, driving in traffic, caring for children, being in debt, or being in a difficult relationship. What are the signs or symptoms? Stress may cause emotional symptoms including, the following:  Anxiety. This is feeling worried, afraid, on edge, overwhelmed, or out of control.  Anger. This is feeling irritated or impatient.  Depression. This is feeling sad, down, helpless, or guilty.  Difficulty focusing, remembering, or making decisions. Stress may cause physical symptoms, including the following:  Aches and pains. These may affect your head, neck, back, stomach, or other areas of your body.  Tight muscles or clenched jaw.  Low energy or trouble sleeping. Stress may cause unhealthy behaviors, including the following:  Eating to feel better (overeating) or skipping meals.  Sleeping too  little, too much, or both.  Working too much or putting off tasks (procrastination).  Smoking, drinking alcohol, or using drugs to feel better. How is this diagnosed? Stress is diagnosed through an assessment by your health care provider. Your health care provider will ask questions about your symptoms and any stressful life events.Your health care provider will also ask about your medical history and may order blood tests or other tests. Certain medical conditions and medicine can cause physical symptoms similar to stress. Mental illness can cause emotional symptoms and unhealthy behaviors similar to stress. Your health care provider may refer you to a mental health professional for further evaluation. How is this treated? Stress management is the recommended treatment for stress.The goals of stress management are reducing stressful life events and coping with stress in healthy ways. Techniques for reducing stressful life events include the following:  Stress identification. Self-monitor for stress and identify what causes stress for you. These skills may help you to avoid some stressful events.  Time management. Set your priorities, keep a calendar of events, and learn to say "no." These tools can help you avoid making too many commitments. Techniques for coping with stress include the following:  Rethinking the problem.  Try to think realistically about stressful events rather than ignoring them or overreacting. Try to find the positives in a stressful situation rather than focusing on the negatives.  Exercise. Physical exercise can release both physical and emotional tension. The key is to find a form of exercise you enjoy and do it regularly.  Relaxation techniques. These relax the body and mind. Examples include yoga, meditation, tai chi, biofeedback, deep breathing, progressive muscle relaxation, listening to music, being out in nature, journaling, and other hobbies. Again, the key is to  find one or more that you enjoy and can do regularly.  Healthy lifestyle. Eat a balanced diet, get plenty of sleep, and do not smoke. Avoid using alcohol or drugs to relax.  Strong support network. Spend time with family, friends, or other people you enjoy being around.Express your feelings and talk things over with someone you trust. Counseling or talktherapy with a mental health professional may be helpful if you are having difficulty managing stress on your own. Medicine is typically not recommended for the treatment of stress.Talk to your health care provider if you think you need medicine for symptoms of stress. Follow these instructions at home:  Keep all follow-up visits as directed by your health care provider.  Take all medicines as directed by your health care provider. Contact a health care provider if:  Your symptoms get worse or you start having new symptoms.  You feel overwhelmed by your problems and can no longer manage them on your own. Get help right away if:  You feel like hurting yourself or someone else. This information is not intended to replace advice given to you by your health care provider. Make sure you discuss any questions you have with your health care provider. Document Released: 07/11/2000 Document Revised: 06/23/2015 Document Reviewed: 09/09/2012 Elsevier Interactive Patient Education  2017 [employer].   IF you received an x-ray today, you will receive an invoice from [facility]. Please contact [facility] at [phone] with questions or concerns regarding your invoice.   IF you received labwork today, you will receive an invoice from [employer]. Please contact [facility] at [phone] with questions or concerns regarding your invoice.   Our billing staff will not be able to assist you with questions regarding bills from these companies.  You will be contacted with the lab results as soon as they are  available. The fastest way to get your results is to activate your My Chart account. Instructions are located on the last page of this paperwork. If you have not heard from [place] regarding the results in 2 weeks, please contact this office.

## 2015-12-15 NOTE — Progress Notes (Signed)
By signing my name below, I, Mesha Guinyard, attest that this documentation has been prepared under the direction and in the presence of Merri Ray, MD.  Electronically Signed: Verlee Monte, Medical Scribe. 12/15/15. 9:09 AM.  Subjective:    Patient ID: Matthew Henson, male    DOB: [birth year], 55 y.o.   MRN: [id number]  HPI Chief Complaint  Patient presents with  . Annual Exam  . Medication Refill    XANAX and WELLBUTRIN XL  . Depression    positive answers in triage see last question **    HPI Comments: Matthew Henson is a 55 y.o. male with a PMHx of asthma, depression with anxiety, diverticulitis, and prostatitis who presents to the [facility] for his annual complete physical. Prev pt of Dr. Everlene Farrier. Last annual exam was 11/2014, prostatitis at that time; treated with abx. He was seen by [facility] for prostatitis and treated with flomax for BPH. Pt is fasting.  HLD: Recommended lipitor 30m QD at last physical. He never took lipitor and has changed his diet since his last visit. Lab Results  Component Value Date   CHOL 214 (H) 12/21/2014   HDL 49 12/21/2014   LDLCALC 140 (H) 12/21/2014   TRIG 126 12/21/2014   CHOLHDL 4.4 12/21/2014   Lab Results  Component Value Date   ALT 24 12/21/2014   AST 29 12/21/2014   ALKPHOS 52 12/21/2014   BILITOT 0.9 12/21/2014   Cancer Screening: Prostate CA: Referred to urology of last year for elevating PSA and enlarging prostate. Followed by Dr. EJunious Silk appt Jan 18th. Pt is no longer taking flomax and states it relieve him of his symptoms. Last had his PSA tested a couple of months back. Reports cialis worked temporarily and stopped taking it when it didn't work. He had a infected tooth shortly after discontinuing cialis and took augmentin for his tooth and his symptoms subsided. Denies trouble urinating, and nocturia.  Lab Results  Component Value Date   PSA 2.73 12/21/2014   PSA 2.17 11/17/2013   PSA 1.81  11/04/2012  Colon CA: Colonoscopy due to diverticulitis and told to repeat in 10 years. Suspects he was last seen 8 years ago.  Immunizations: Would like to get the flu shot today. Immunization History  Administered Date(s) Administered  . Td 01/29/1993   Vision: Has appt once a year.  Visual Acuity Screening   Right eye Left eye Both eyes  Without correction:     With correction: '20/20 20/20 20/20 '   Dentist: Has biannual appts.  Exercise: 4x a week.  Hep C/HIV Screening: Negative Hep C screen in 11/2014, and non reactive HIV 11/2014  Depression: Takes wellbutrin 3053mQD, xanax PRN with prescription for #20 and 3 refill in 11/2014. Nl TSH in 11/2014. Has been on the same dose of wellbutrin for a year and mentions it never worked for him but it maintained his mood. Takes xanax a couple times a month and uses it as a last resort. 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 When he gets stressed out, he thinks life would be easier if he wasn't around but he has never made plans of suicide or hurt himself. He hasn't seen a life coach or therapist in a while and states talking about it isn't enough. When he tried to speak with a psychiatrist in the past nobody was available. Pt tried paxil years ago,  but hasn't tried prozac or zoloft. He drinks 1-2 light beers every other day and does not smoke or do illicit drugs. Pt is taking his vacation days next week and plans on going to the beach. Depression screen [facility] 2/9 12/15/2015 12/21/2014 08/30/2014 12/29/2013  Decreased Interest 1 0 0 0  Down, Depressed, Hopeless 1 0 0 0  PHQ - 2 Score 2 0 0 0  Altered sleeping 0 - - -  Tired, decreased energy 1 - - -  Change in appetite 0 - - -  Feeling bad or failure about yourself  1 - - -  Trouble concentrating 1 - - -  Moving slowly or fidgety/restless 0 - - -  Suicidal thoughts 1 - - -  PHQ-9 Score 6 - - -    Patient Active Problem List   Diagnosis Date Noted  . Diverticulosis of colon without hemorrhage 12/21/2014  . Bradycardia 11/17/2013  . Depression 10/09/2011  . 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 Depression   . GERD (gastroesophageal reflux disease)   . Other dyspnea and respiratory abnormality    Presumptive diagnosis of exercised induced asthma   Past Surgical History:  Procedure Laterality Date  . None    . VASECTOMY     No Known Allergies Prior to Admission medications   Medication Sig Start Date End Date Taking? Authorizing Provider  ALPRAZolam Duanne Moron) 0.5 MG tablet take 1 tablet as needed for stress every 8-12 hours 12/21/14  Yes Darlyne Russian, MD  buPROPion (WELLBUTRIN XL) 300 MG 24 hr tablet Take 1 tablet (300 mg total) by mouth daily. 08/31/15  Yes Darlyne Russian, MD  tamsulosin (FLOMAX) 0.4 MG CAPS capsule Take 1 capsule (0.4 mg total) by mouth daily. 02/08/15  Yes Darlyne Russian, MD   Social History   Social History  . Marital status: Married    Spouse name: N/A  . Number of children: 2  . Years of education: N/A   Occupational History  .  [place] History Main Topics  . Smoking status: Former Research scientist (life sciences)  . Smokeless tobacco: Never Used     Comment: Light smoker years ago.  . Alcohol use 3.6 oz/week    6 Cans of beer per week     Comment: LIGHT BEER  . Drug use: No  . Sexual activity: Yes   Other Topics Concern  . Not on file   Social History Narrative   Lives at home with wife and 2 children.   Stressful job.     Exercise biking and lift weights   Review of Systems  13 point ROS positive for suicidal ideation, and thoughts of self harm, otherwise negative. Objective:  Physical Exam BP 110/70 (BP Location: Left Arm, Patient Position: Sitting, Cuff Size: Large)   Pulse (!) 59   Temp 97.8 F (36.6 C) (Oral)   Resp 16   Ht 5' 9.25" (1.759 m)   Wt 219 lb 3.2 oz (99.4 kg)   SpO2 97%   BMI  32.14 kg/m    Results for orders placed or performed in visit on 12/15/15  COMPLETE METABOLIC PANEL WITH GFR  Result Value Ref Range   Sodium 140 135 - 146 mmol/L   Potassium 4.6 3.5 - 5.3 mmol/L   Chloride 105 98 - 110 mmol/L   CO2 27 20 - 31 mmol/L   Glucose, Bld 99 65 - 99 mg/dL   BUN 19 7 -  25 mg/dL   Creat 1.05 0.70 - 1.33 mg/dL   Total Bilirubin 0.5 0.2 - 1.2 mg/dL   Alkaline Phosphatase 58 40 - 115 U/L   AST 25 10 - 35 U/L   ALT 23 9 - 46 U/L   Total Protein 6.9 6.1 - 8.1 g/dL   Albumin 4.3 3.6 - 5.1 g/dL   Calcium 9.1 8.6 - 10.3 mg/dL   GFR, Est African American >89 >=60 mL/min   GFR, Est Non African American 81 >=60 mL/min  Lipid panel  Result Value Ref Range   Cholesterol 184 <200 mg/dL   Triglycerides 111 <150 mg/dL   HDL 44 >40 mg/dL   Total CHOL/HDL Ratio 4.2 <5.0 Ratio   VLDL 22 <30 mg/dL   LDL Cholesterol 118 (H) <100 mg/dL  TSH  Result Value Ref Range   TSH  0.40 - 4.50 mIU/L   Assessment & Plan:  Approx 20 min discussing depression, anxiety, stress, and previous treatment plans.  Shloma Roggenkamp is a 55 y.o. male Annual physical exam  - -anticipatory guidance as below in AVS, screening labs above. Health maintenance items as above in HPI discussed/recommended as applicable.   Stress, Depression with anxiety - Plan: TSH, Ambulatory referral to Psychiatry, FLUoxetine (PROZAC) 20 MG tablet, DISCONTINUED: FLUoxetine (PROZAC) 20 MG tablet  - with mood swings and lack of relief with wellbutrin, ddx includes bipolar disease, but denies true mania. No suicidal intent or plan.   -refer to psychiatry, but for now - change to prozac 68m qd, SED, and RTC/ER precautions if any manic or suicidal sx's.    -stress mgt discussed, but also importance of counseling, possible CBT - numbers provided.   - recheck in 3-4 weeks if not seen by psychiatry by that point.   Screening for hyperlipidemia - Plan: COMPLETE METABOLIC PANEL WITH GFR, Lipid panel  - likely will need to be on  statin but will check lipids first.   Needs flu shot - Plan: Flu Vaccine QUAD 36+ mos IM given   Meds ordered this encounter  Medications  . DISCONTD: FLUoxetine (PROZAC) 20 MG tablet    Sig: Take 1 tablet (20 mg total) by mouth daily.    Dispense:  30 tablet    Refill:  1  . ALPRAZolam (XANAX) 0.5 MG tablet    Sig: take 1 tablet as needed for stress every 8-12 hours    Dispense:  20 tablet    Refill:  1  . FLUoxetine (PROZAC) 20 MG tablet    Sig: Take 1 tablet (20 mg total) by mouth daily.    Dispense:  90 tablet    Refill:  0   Patient Instructions   Call one of the counselors to setup appointment.  AVivia Budge [phone] [phone] Hoehn [phone] Stop the Wellbutrin, start Prozac one per day. Follow-up with me in the next 3-4 weeks to discuss medication further. Sooner if worse. As we discussed if you have any suicidal thoughts, be seen right away at the emergency room. I did go ahead and place a referral to psychiatry to discuss medications, but if you're doing well on the medications I prescribed, you can always cancel that appointment.  We can discuss the plan from the urologist further next visit.  If cholesterol is elevated, we can look into overall heart disease risk and decide on medication at that time.  Return to the clinic or go to the nearest emergency room if any of your symptoms worsen or  new symptoms occur.   Keeping you healthy  Get these tests  Blood pressure- Have your blood pressure checked once a year by your healthcare provider.  Normal blood pressure is 120/80  Weight- Have your body mass index (BMI) calculated to screen for obesity.  BMI is a measure of body fat based on height and weight. You can also calculate your own BMI at ViewBank[employer].si.  Cholesterol- Have your cholesterol checked every year.  Diabetes- Have your blood sugar checked regularly if you have high blood pressure, high cholesterol, have a family  history of diabetes or if you are overweight.  Screening for Colon Cancer- Colonoscopy starting at age 85.  Screening may begin sooner depending on your family history and other health conditions. Follow up colonoscopy as directed by your Gastroenterologist.  Screening for Prostate Cancer- Both blood work (PSA) and a rectal exam help screen for Prostate Cancer.  Screening begins at age 51 with African-American men and at age 98 with Caucasian men.  Screening may begin sooner depending on your family history.  Take these medicines  Aspirin- One aspirin daily can help prevent Heart disease and Stroke.  Flu shot- Every fall.  Tetanus- Every 10 years.  Zostavax- Once after the age of 74 to prevent Shingles.  Pneumonia shot- Once after the age of 76; if you are younger than 65, ask your healthcare provider if you need a Pneumonia shot.  Take these steps  Don't smoke- If you do smoke, talk to your doctor about quitting.  For tips on how to quit, go to www.smokefree.gov or call 1-800-QUIT-NOW.  Be physically active- Exercise 5 days a week for at least 30 minutes.  If you are not already physically active start slow and gradually work up to 30 minutes of moderate physical activity.  Examples of moderate activity include walking briskly, mowing the yard, dancing, swimming, bicycling, etc.  Eat a healthy diet- Eat a variety of healthy food such as fruits, vegetables, low fat milk, low fat cheese, yogurt, lean meant, poultry, fish, beans, tofu, etc. For more information go to www.thenutritionsource.org  Drink alcohol in moderation- Limit alcohol intake to less than two drinks a day. Never drink and drive.  Dentist- Brush and floss twice daily; visit your dentist twice a year.  Depression- Your emotional health is as important as your physical health. If you're feeling down, or losing interest in things you would normally enjoy please talk to your healthcare provider.  Eye exam- Visit your eye  doctor every year.  Safe sex- If you may be exposed to a sexually transmitted infection, use a condom.  Seat belts- Seat belts can save your life; always wear one.  Smoke/Carbon Monoxide detectors- These detectors need to be installed on the appropriate level of your home.  Replace batteries at least once a year.  Skin cancer- When out in the sun, cover up and use sunscreen 15 SPF or higher.  Violence- If anyone is threatening you, please tell your healthcare provider.  Living Will/ Health care power of attorney- Speak with your healthcare provider and family. Stress and Stress Management Stress is a normal reaction to life events. It is what you feel when life demands more than you are used to or more than you can handle. Some stress can be useful. For example, the stress reaction can help you catch the last bus of the day, study for a test, or meet a deadline at work. But stress that occurs too often or for too long can  cause problems. It can affect your emotional health and interfere with relationships and normal daily activities. Too much stress can weaken your immune system and increase your risk for physical illness. If you already have a medical problem, stress can make it worse. What are the causes? All sorts of life events may cause stress. An event that causes stress for one person may not be stressful for another person. Major life events commonly cause stress. These may be positive or negative. Examples include losing your job, moving into a new home, getting married, having a baby, or losing a loved one. Less obvious life events may also cause stress, especially if they occur day after day or in combination. Examples include working long hours, driving in traffic, caring for children, being in debt, or being in a difficult relationship. What are the signs or symptoms? Stress may cause emotional symptoms including, the following:  Anxiety. This is feeling worried, afraid, on edge,  overwhelmed, or out of control.  Anger. This is feeling irritated or impatient.  Depression. This is feeling sad, down, helpless, or guilty.  Difficulty focusing, remembering, or making decisions. Stress may cause physical symptoms, including the following:  Aches and pains. These may affect your head, neck, back, stomach, or other areas of your body.  Tight muscles or clenched jaw.  Low energy or trouble sleeping. Stress may cause unhealthy behaviors, including the following:  Eating to feel better (overeating) or skipping meals.  Sleeping too little, too much, or both.  Working too much or putting off tasks (procrastination).  Smoking, drinking alcohol, or using drugs to feel better. How is this diagnosed? Stress is diagnosed through an assessment by your health care provider. Your health care provider will ask questions about your symptoms and any stressful life events.Your health care provider will also ask about your medical history and may order blood tests or other tests. Certain medical conditions and medicine can cause physical symptoms similar to stress. Mental illness can cause emotional symptoms and unhealthy behaviors similar to stress. Your health care provider may refer you to a mental health professional for further evaluation. How is this treated? Stress management is the recommended treatment for stress.The goals of stress management are reducing stressful life events and coping with stress in healthy ways. Techniques for reducing stressful life events include the following:  Stress identification. Self-monitor for stress and identify what causes stress for you. These skills may help you to avoid some stressful events.  Time management. Set your priorities, keep a calendar of events, and learn to say "no." These tools can help you avoid making too many commitments. Techniques for coping with stress include the following:  Rethinking the problem. Try to think  realistically about stressful events rather than ignoring them or overreacting. Try to find the positives in a stressful situation rather than focusing on the negatives.  Exercise. Physical exercise can release both physical and emotional tension. The key is to find a form of exercise you enjoy and do it regularly.  Relaxation techniques. These relax the body and mind. Examples include yoga, meditation, tai chi, biofeedback, deep breathing, progressive muscle relaxation, listening to music, being out in nature, journaling, and other hobbies. Again, the key is to find one or more that you enjoy and can do regularly.  Healthy lifestyle. Eat a balanced diet, get plenty of sleep, and do not smoke. Avoid using alcohol or drugs to relax.  Strong support network. Spend time with family, friends, or other people you enjoy  being around.Express your feelings and talk things over with someone you trust. Counseling or talktherapy with a mental health professional may be helpful if you are having difficulty managing stress on your own. Medicine is typically not recommended for the treatment of stress.Talk to your health care provider if you think you need medicine for symptoms of stress. Follow these instructions at home:  Keep all follow-up visits as directed by your health care provider.  Take all medicines as directed by your health care provider. Contact a health care provider if:  Your symptoms get worse or you start having new symptoms.  You feel overwhelmed by your problems and can no longer manage them on your own. Get help right away if:  You feel like hurting yourself or someone else. This information is not intended to replace advice given to you by your health care provider. Make sure you discuss any questions you have with your health care provider. Document Released: 07/11/2000 Document Revised: 06/23/2015 Document Reviewed: 09/09/2012 Elsevier Interactive Patient Education  2017  [employer].   IF you received an x-ray today, you will receive an invoice from [facility]. Please contact [facility] at [phone] with questions or concerns regarding your invoice.   IF you received labwork today, you will receive an invoice from [employer]. Please contact [facility] at [phone] with questions or concerns regarding your invoice.   Our billing staff will not be able to assist you with questions regarding bills from these companies.  You will be contacted with the lab results as soon as they are available. The fastest way to get your results is to activate your My Chart account. Instructions are located on the last page of this paperwork. If you have not heard from [place] regarding the results in 2 weeks, please contact this office.       I personally performed the services described in this documentation, which was scribed in my presence. The recorded information has been reviewed and considered, and addended by me as needed.   Signed,   Merri Ray, MD [facility].  12/17/15 1:13 PM

## 2015-12-15 NOTE — Telephone Encounter (Signed)
Faxed Rx for alprazolam 0.5 mg to Kristopher Oppenheim at [facility], per Dr Carlota Raspberry. Confirmation page received at 12:10 pm.

## 2016-02-09 ENCOUNTER — Telehealth: Payer: Self-pay

## 2016-02-09 ENCOUNTER — Ambulatory Visit
Admission: RE | Admit: 2016-02-09 | Discharge: 2016-02-09 | Disposition: A | Discharge status: Home / Self Care | Payer: BLUE CROSS/BLUE SHIELD | Source: Ambulatory Visit | Attending: Family Medicine | Admitting: Family Medicine

## 2016-02-09 ENCOUNTER — Encounter: Payer: Self-pay | Admitting: Family Medicine

## 2016-02-09 ENCOUNTER — Ambulatory Visit (INDEPENDENT_AMBULATORY_CARE_PROVIDER_SITE_OTHER): Payer: BLUE CROSS/BLUE SHIELD | Admitting: Family Medicine

## 2016-02-09 VITALS — BP 116/71 | HR 49 | Temp 97.9°F | Resp 18 | Ht 69.25 in | Wt 217.0 lb

## 2016-02-09 DIAGNOSIS — F439 Reaction to severe stress, unspecified: Secondary | ICD-10-CM | POA: Diagnosis not present

## 2016-02-09 DIAGNOSIS — R1030 Lower abdominal pain, unspecified: Secondary | ICD-10-CM | POA: Diagnosis not present

## 2016-02-09 DIAGNOSIS — F418 Other specified anxiety disorders: Secondary | ICD-10-CM

## 2016-02-09 DIAGNOSIS — K5732 Diverticulitis of large intestine without perforation or abscess without bleeding: Secondary | ICD-10-CM

## 2016-02-09 DIAGNOSIS — R0609 Other forms of dyspnea: Secondary | ICD-10-CM | POA: Diagnosis not present

## 2016-02-09 DIAGNOSIS — R0789 Other chest pain: Secondary | ICD-10-CM | POA: Diagnosis not present

## 2016-02-09 DIAGNOSIS — R001 Bradycardia, unspecified: Secondary | ICD-10-CM

## 2016-02-09 LAB — POCT URINALYSIS DIP (MANUAL ENTRY)
BILIRUBIN UA: NEGATIVE
Blood, UA: NEGATIVE
GLUCOSE UA: NEGATIVE
Ketones, POC UA: NEGATIVE
LEUKOCYTES UA: NEGATIVE
NITRITE UA: NEGATIVE
Protein Ur, POC: NEGATIVE
Spec Grav, UA: 1.025
UROBILINOGEN UA: 0.2
pH, UA: 6

## 2016-02-09 IMAGING — CT CT ABD-PELV W/ CM
1 of 3 series · 14 of 32 positions shown, 19 images · IV contrast (APPLIED)
Comparison: None.

CLINICAL DATA: Lower abdomen pain for 6 findings

EXAM:
CT ABDOMEN AND PELVIS WITH CONTRAST
TECHNIQUE: Multidetector CT imaging of the abdomen and pelvis was performed
using the standard protocol following bolus administration of
intravenous contrast.
CONTRAST:  100mL [DT] IOPAMIDOL ([DT]) INJECTION 61%

[Series 2: abd/pelvis w/cm · axial · 0.81mm/px · z∈[-501,-36]mm · 14 of 105 slices shown, 19 images]
[im 6/105  soft-tissue]
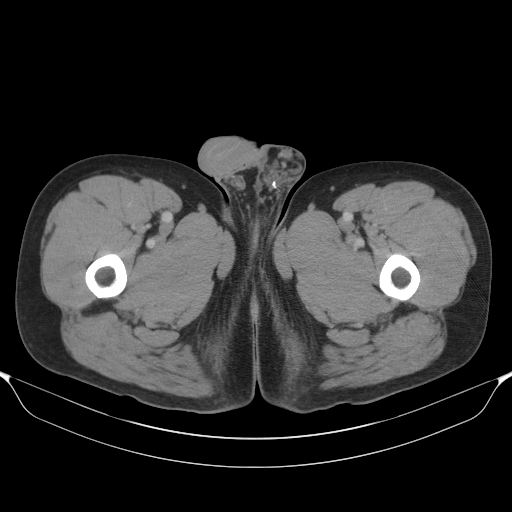
[im 6/105  bone]
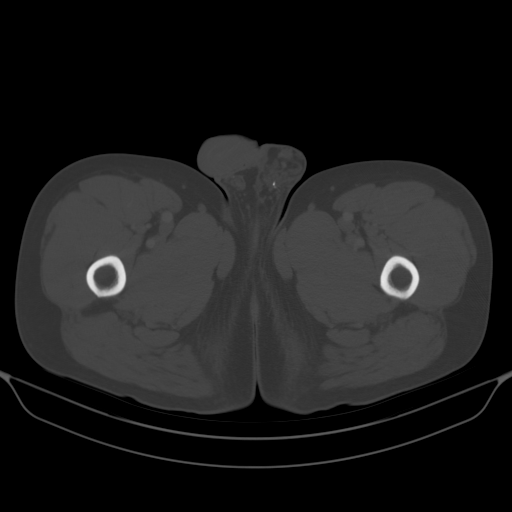
[im 17/105  soft-tissue]
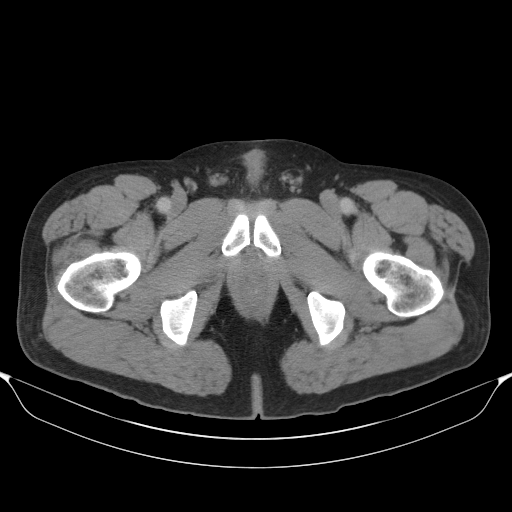
[im 22/105  soft-tissue]
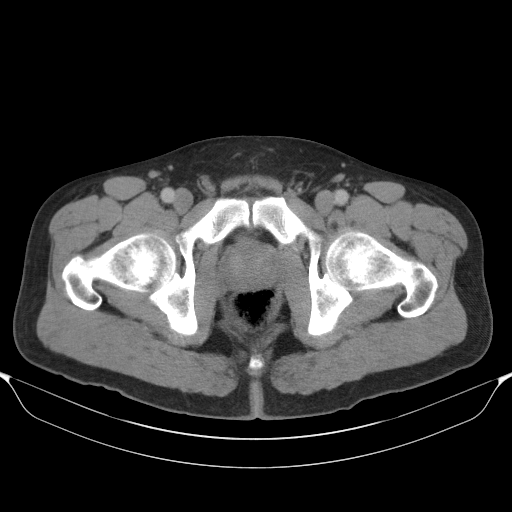
[im 28/105  soft-tissue]
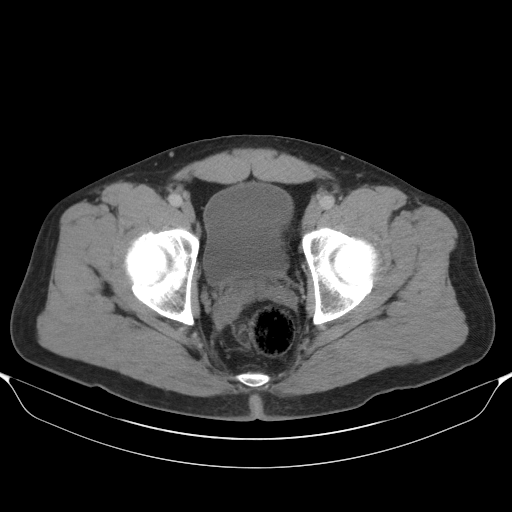
[im 39/105  soft-tissue]
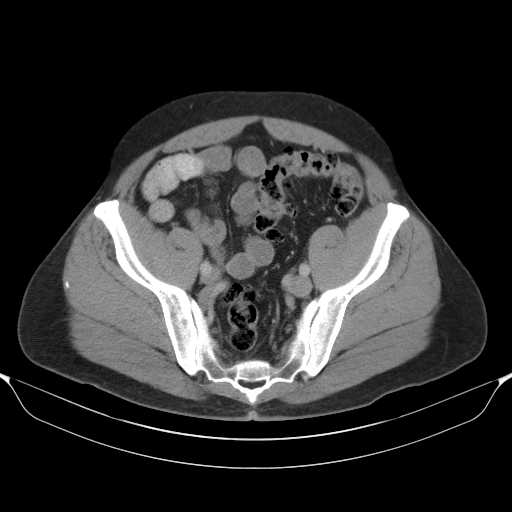
[im 44/105  soft-tissue]
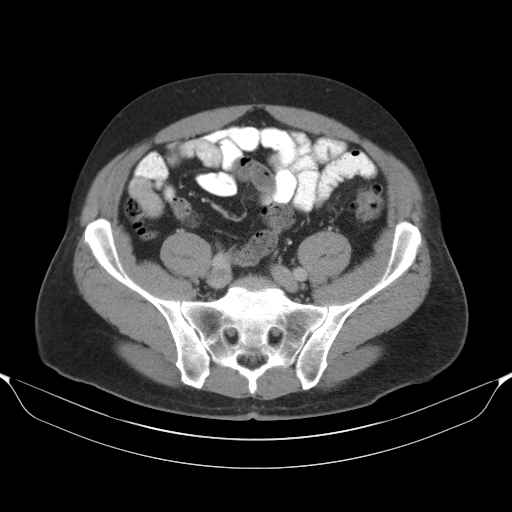
[im 55/105  soft-tissue]
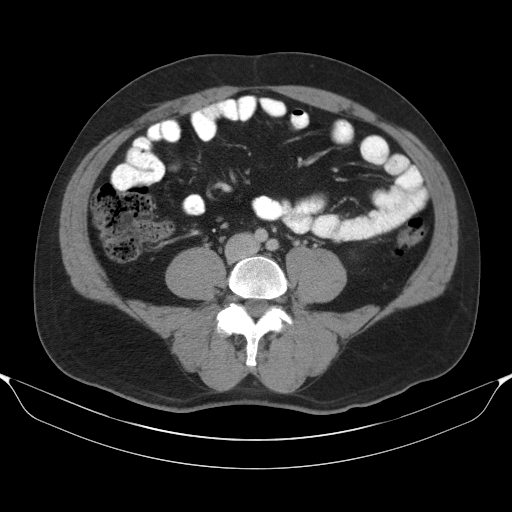
[im 61/105  soft-tissue]
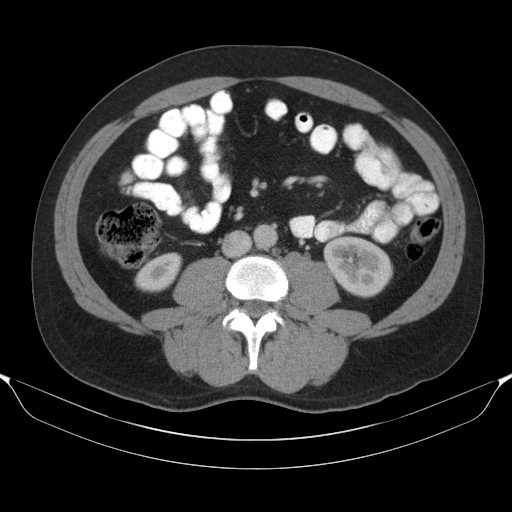
[im 66/105  soft-tissue]
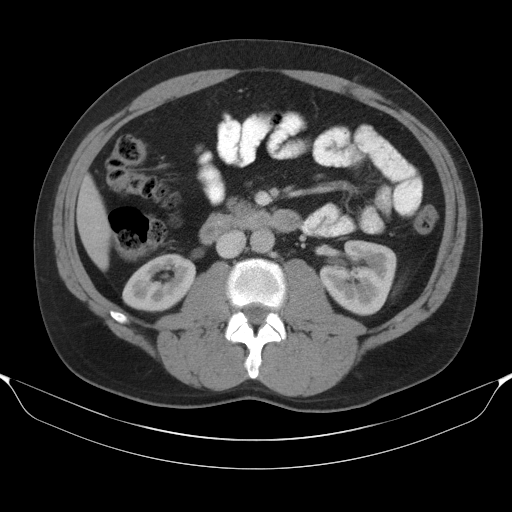
[im 66/105  bone]
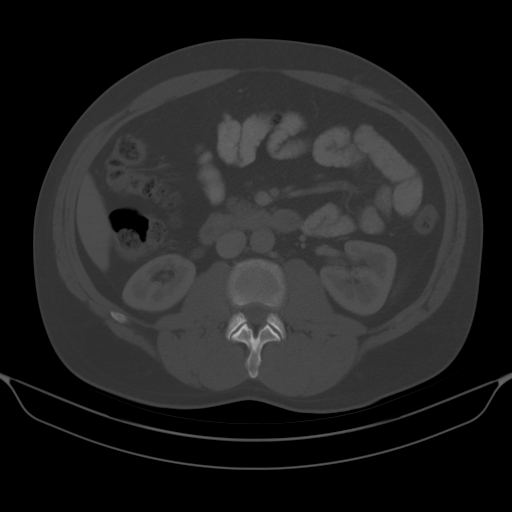
[im 77/105  soft-tissue]
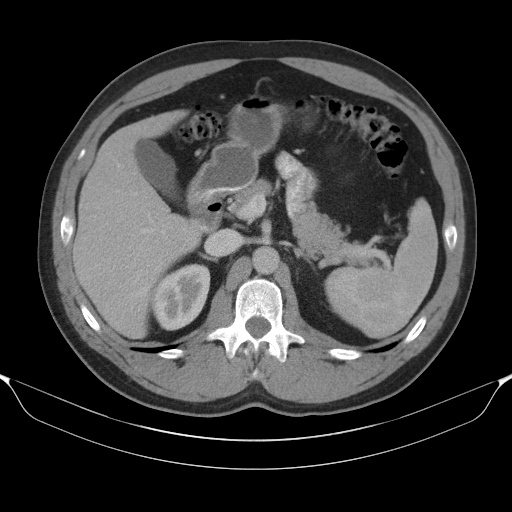
[im 83/105  soft-tissue]
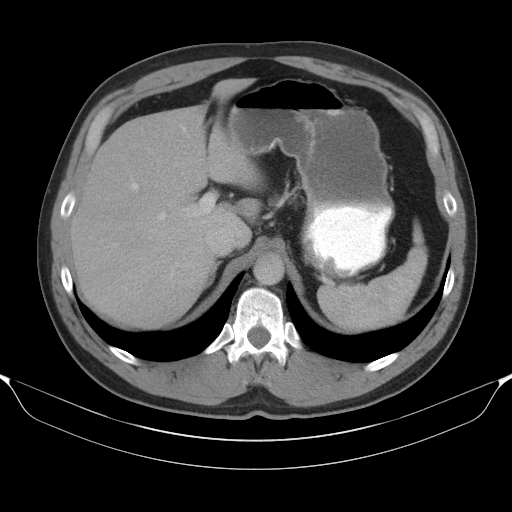
[im 83/105  lung]
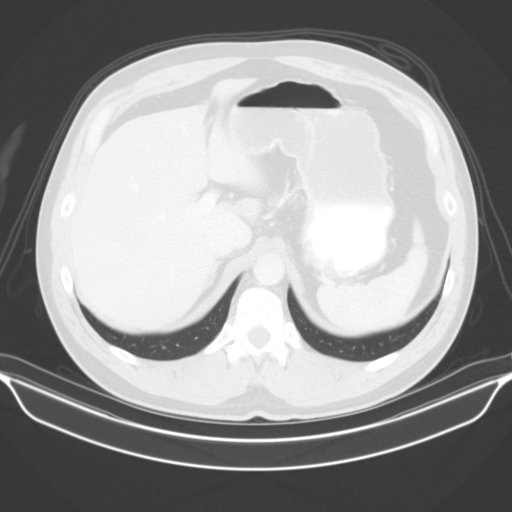
[im 88/105  soft-tissue]
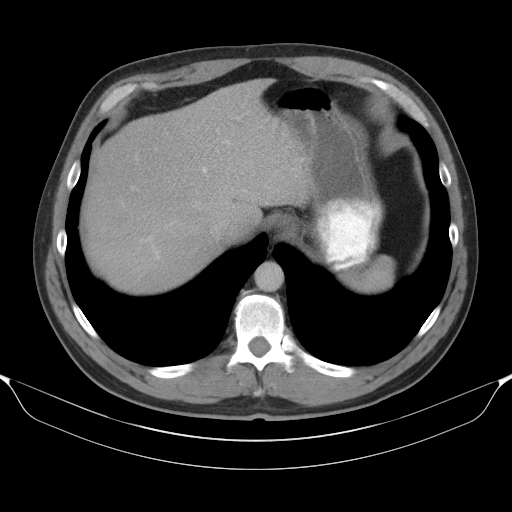
[im 88/105  lung]
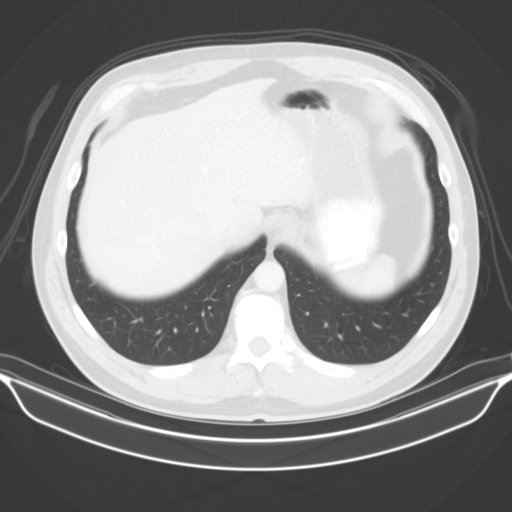
[im 94/105  lung]
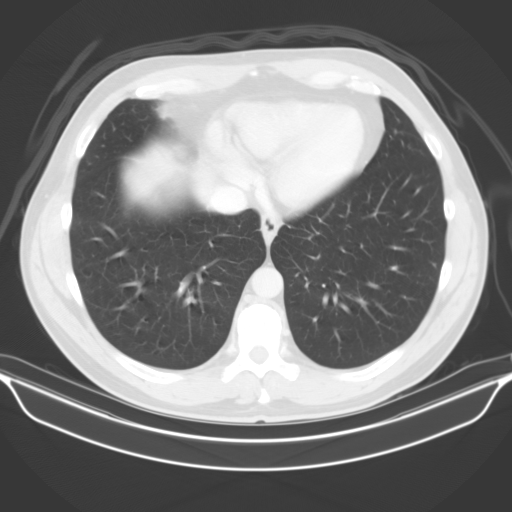
[im 99/105  soft-tissue]
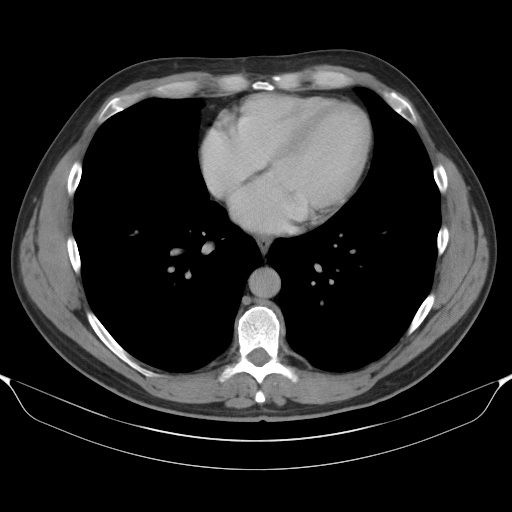
[im 99/105  lung]
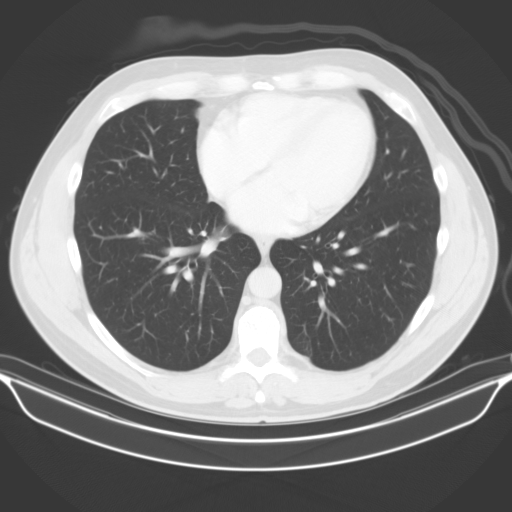

[14 of 32 positions shown; findings below may reference images not displayed]

FINDINGS: Lower chest: The lung bases are clear. The heart is within normal
limits in size.

Hepatobiliary: The liver enhances with no focal abnormality and no
ductal dilatation is seen. No calcified gallstones are noted.

Pancreas: The pancreas is normal in size and the pancreatic duct is
not dilated.

Spleen: The spleen is somewhat inhomogeneous most likely due to
timing of imaging. No focal abnormality is evident.

Adrenals/Urinary Tract: The adrenal glands are unremarkable. The
kidneys enhance with no calculus or mass, and on delayed images the
pelvocaliceal systems are unremarkable. The ureters appear normal in
caliber. The urinary bladder is not well distended but no
abnormality is seen.

Stomach/Bowel: The stomach is moderately distended with oral
contrast and air. No abnormality is seen. No small bowel distention
is noted. There are multiple rectosigmoid colon diverticula present
but no diverticulitis is seen. There is feces throughout the colon.
The terminal ileum and the appendix are unremarkable.

Vascular/Lymphatic: The abdominal aorta is normal in caliber. No
adenopathy is seen.

Reproductive: The prostate is minimally prominent, measuring 3.8 x
4.8 cm.

Other: None.

Musculoskeletal: The lumbar vertebrae are in normal alignment with
normal intervertebral disc spaces.
IMPRESSION: 1. There is no explanation for the patient's lower abdomen pain.
There are multiple rectosigmoid colon diverticula present but no
diverticulitis is noted.
2. No renal or ureteral calculi are seen.
3. Slight prominence of the prostate.

## 2016-02-09 MED ORDER — IOPAMIDOL (ISOVUE-300) INJECTION 61%
100.0000 mL | Freq: Once | INTRAVENOUS | Status: AC | PRN
  Administered 2016-02-09: 100 mL via INTRAVENOUS

## 2016-02-09 MED ORDER — FLUOXETINE HCL 20 MG PO TABS: 20.0000 mg | ORAL_TABLET | Freq: Every day | ORAL | 1 refills | Status: DC

## 2016-02-09 NOTE — Telephone Encounter (Signed)
Left VM with patient regarding STAT CT . [facility] He will need to pick up the contrast fluid by noon today to begin drinking prior to the procedure. He is also not to eat any solid foods after 10am this morning. Should he decide to reschedule, he can call their office at [phone] but I have strongly encouraged him to go today.

## 2016-02-09 NOTE — Progress Notes (Signed)
By signing my name below I, Tereasa Coop, attest that this documentation has been prepared under the direction and in the presence of Wendie Agreste, MD. Electonically Signed. Tereasa Coop, Scribe 02/09/2016 at 8:30 AM  Subjective:    Patient ID: Matthew Henson, male    DOB: 02/07/1960, 56 y.o.   MRN: [id number]  Chief Complaint  Patient presents with  . Follow-up    prozac    HPI Matthew Henson is a 56 y.o. male who presents to the [facility] for follow up reevaluation of depression. Pt was last seen and evaluated by Dr Carlota Raspberry on 12/15/15.  Depression Pt was changed from wellbutrin to 20mg  prozac QD at last visit. Pt was cautioned on manic symptoms with his history of mood swings. Stress management was discussed, counseling numbers given, and pt was referred to psychiatry. Pt also continued on 0.5mg  xanax PRN and was prescribed #20 with one refill. Today, pt reports that the prozac as been doing great and states that he "feels better all around." Denies manic episodes. Denies any other noticeable side effects.   HLD Lab Results  Component Value Date   CHOL 184 12/15/2015   HDL 44 12/15/2015   LDLCALC 118 (H) 12/15/2015   TRIG 111 12/15/2015   CHOLHDL 4.2 12/15/2015   Lab Results  Component Value Date   ALT 23 12/15/2015   AST 25 12/15/2015   ALKPHOS 58 12/15/2015   BILITOT 0.5 12/15/2015   Numbers are improved from previous readings. Plan for continued diet an exercise with recheck in 6 months.   CP Today pt states he has been pushing his exercising more and has noticed that if he exerts himself too much he develops mild CP and SOB. CP described as a chest heaviness. Pt's brother died at 37 from CHF.   Abd Pain Pt also c/o lower abd pain for the past month. Pain is described as a "dull achy pain". Pain is mostly constant and improved with BMs. BMs have been normal otherwise. Seen by urology. Pt has history of diverticulitis. Pt has had 4 prior episodes of  diverticulitis w/ last episodes occurring a year ago.   Patient Active Problem List   Diagnosis Date Noted  . Diverticulosis of colon without hemorrhage 12/21/2014  . Bradycardia 11/17/2013  . Depression 10/09/2011  . 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 Depression   . GERD (gastroesophageal reflux disease)   . Other dyspnea and respiratory abnormality    Presumptive diagnosis of exercised induced asthma   Past Surgical History:  Procedure Laterality Date  . None    . VASECTOMY     No Known Allergies Prior to Admission medications   Medication Sig Start Date End Date Taking? Authorizing Provider  FLUoxetine (PROZAC) 20 MG tablet Take 1 tablet (20 mg total) by mouth daily. 12/15/15  Yes Wendie Agreste, MD  ALPRAZolam Duanne Moron) 0.5 MG tablet take 1 tablet as needed for stress every 8-12 hours Patient not taking: Reported on 02/09/2016 12/15/15   Wendie Agreste, MD   Social History   Social History  . Marital status: Married    Spouse name: N/A  . Number of children: 2  . Years of education: N/A   Occupational History  .  [place] History Main Topics  . Smoking status: Former Research scientist (life sciences)  . Smokeless tobacco: Never Used     Comment: Light smoker years ago.  . Alcohol  use 3.6 oz/week    6 Cans of beer per week     Comment: LIGHT BEER  . Drug use: No  . Sexual activity: Yes   Other Topics Concern  . Not on file   Social History Narrative   Lives at home with wife and 2 children.   Stressful job.     Exercise biking and lift weights      Review of Systems  Constitutional: Negative for fatigue and unexpected weight change.  Eyes: Negative for visual disturbance.  Respiratory: Positive for shortness of breath (after hard exertion). Negative for cough and chest tightness.   Cardiovascular: Positive for chest pain (with hard exertion only). Negative for palpitations and leg swelling.  Gastrointestinal:  Negative for abdominal pain and blood in stool.  Neurological: Negative for dizziness, light-headedness and headaches.  Psychiatric/Behavioral: Negative for agitation, dysphoric mood and suicidal ideas. The patient is not nervous/anxious and is not hyperactive.        Objective:   Physical Exam  Constitutional: He is oriented to person, place, and time. He appears well-developed and well-nourished.  HENT:  Head: Normocephalic and atraumatic.  Eyes: EOM are normal. Pupils are equal, round, and reactive to light.  Neck: No JVD present. Carotid bruit is not present.  Cardiovascular: Normal rate, regular rhythm and normal heart sounds.   No murmur heard. Pulmonary/Chest: Effort normal and breath sounds normal. He has no rales.  Abdominal: Soft. Normal appearance and bowel sounds are normal. There is tenderness in the suprapubic area and left lower quadrant. There is no rigidity, no rebound, no guarding, no tenderness at McBurney's point and negative Murphy's sign.  Musculoskeletal: He exhibits no edema.  Neurological: He is alert and oriented to person, place, and time.  Skin: Skin is warm and dry.  Psychiatric: He has a normal mood and affect.  Vitals reviewed.    Vitals:   02/09/16 0811  BP: 116/71  Pulse: (!) 49  Resp: 18  Temp: 97.9 F (36.6 C)  TempSrc: Oral  SpO2: 97%  Weight: 217 lb (98.4 kg)  Height: 5' 9.25" (1.759 m)   EKG viewed and interpreted by Dr Carlota Raspberry. EKG shows sinus bradycardia with rate of 52. No acute changes.   Results for orders placed or performed in visit on 02/09/16  CBC  Result Value Ref Range   WBC 4.9 3.4 - 10.8 x10E3/uL   RBC 5.02 4.14 - 5.80 x10E6/uL   Hemoglobin 15.3 13.0 - 17.7 g/dL   Hematocrit 43.1 37.5 - 51.0 %   MCV 86 79 - 97 fL   MCH 30.5 26.6 - 33.0 pg   MCHC 35.5 31.5 - 35.7 g/dL   RDW 13.1 12.3 - 15.4 %   Platelets 221 150 - 379 x10E3/uL  Urine Microscopic  Result Value Ref Range   WBC, UA 0-5 0 - 5 /hpf   RBC, UA 0-2 0 - 2  /hpf   Epithelial Cells (non renal) None seen 0 - 10 /hpf   Casts None seen None seen /lpf   Mucus, UA Present Not Estab.   Bacteria, UA Few None seen/Few  POCT urinalysis dipstick  Result Value Ref Range   Color, UA yellow yellow   Clarity, UA clear clear   Glucose, UA negative negative   Bilirubin, UA negative negative   Ketones, POC UA negative negative   Spec Grav, UA 1.025    Blood, UA negative negative   pH, UA 6.0    Protein Ur, POC negative negative  Urobilinogen, UA 0.2    Nitrite, UA Negative Negative   Leukocytes, UA Negative Negative       Assessment & Plan:   Matthew Henson is a 56 y.o. male Stress - Plan: FLUoxetine (PROZAC) 20 MG tablet Depression with anxiety - Plan: FLUoxetine (PROZAC) 20 MG tablet, Care order/instruction:  - Stable. Continue Prozac same dose.  Lower abdominal pain - Plan: CBC, Urine Microscopic, POCT urinalysis dipstick, CT Abdomen Pelvis W Contrast Diverticulitis of colon - Plan: CBC, Urine Microscopic, POCT urinalysis dipstick, CT Abdomen Pelvis W Contrast  -History of diverticulosis of colon, CT abdomen was obtained. Result noted without signs of diverticulitis or cause of abdominal pain. Message for staff to call patient and advised to return if abdominal pain persists in the next week, sooner if worsening. CBC and urinalysis reassuring.  Chest tightness - Plan: EKG 12-Lead, Ambulatory referral to Cardiology Dyspnea on exertion - Plan: EKG 12-Lead, Ambulatory referral to Cardiology Bradycardia  - Advised to avoid exertional exercise until evaluated by cardiology. ER/RTC chest pain precautions given. Long-standing low heart rate per patient, asymptomatic in office.  Meds ordered this encounter  Medications  . FLUoxetine (PROZAC) 20 MG tablet    Sig: Take 1 tablet (20 mg total) by mouth daily.    Dispense:  90 tablet    Refill:  1   Patient Instructions    No change in Prozac dose for now.   I will check a CT scan to see if your  abdominal pain is from diverticulitis. If any worsening symptoms go to the emergency room.  Avoid exertional exercise for now until you have been seen by cardiology and possible stress testing. If any return of chest pain, tightness, or shortness of breath with routine activity, return to clinic or emergency room.     IF you received an x-ray today, you will receive an invoice from [facility]. Please contact [facility] at [phone] with questions or concerns regarding your invoice.   IF you received labwork today, you will receive an invoice from [place]. Please contact [facility] at [phone] with questions or concerns regarding your invoice.   Our billing staff will not be able to assist you with questions regarding bills from these companies.  You will be contacted with the lab results as soon as they are available. The fastest way to get your results is to activate your My Chart account. Instructions are located on the last page of this paperwork. If you have not heard from [place] regarding the results in 2 weeks, please contact this office.       I personally performed the services described in this documentation, which was scribed in my presence. The recorded information has been reviewed and considered, and addended by me as needed.   Signed,   Merri Ray, MD [facility] [place].  02/10/16 4:31 PM

## 2016-02-09 NOTE — Patient Instructions (Addendum)
  No change in Prozac dose for now.   I will check a CT scan to see if your abdominal pain is from diverticulitis. If any worsening symptoms go to the emergency room.  Avoid exertional exercise for now until you have been seen by cardiology and possible stress testing. If any return of chest pain, tightness, or shortness of breath with routine activity, return to clinic or emergency room.     IF you received an x-ray today, you will receive an invoice from [facility]. Please contact [facility] at [phone] with questions or concerns regarding your invoice.   IF you received labwork today, you will receive an invoice from [place]. Please contact [facility] at [phone] with questions or concerns regarding your invoice.   Our billing staff will not be able to assist you with questions regarding bills from these companies.  You will be contacted with the lab results as soon as they are available. The fastest way to get your results is to activate your My Chart account. Instructions are located on the last page of this paperwork. If you have not heard from [place] regarding the results in 2 weeks, please contact this office.

## 2016-02-10 LAB — CBC
HEMATOCRIT: 43.1 % (ref 37.5–51.0)
Hemoglobin: 15.3 g/dL (ref 13.0–17.7)
MCH: 30.5 pg (ref 26.6–33.0)
MCHC: 35.5 g/dL (ref 31.5–35.7)
MCV: 86 fL (ref 79–97)
Platelets: 221 10*3/uL (ref 150–379)
RBC: 5.02 x10E6/uL (ref 4.14–5.80)
RDW: 13.1 % (ref 12.3–15.4)
WBC: 4.9 10*3/uL (ref 3.4–10.8)

## 2016-02-10 LAB — URINALYSIS, MICROSCOPIC ONLY
CASTS: NONE SEEN /LPF
EPITHELIAL CELLS (NON RENAL): NONE SEEN /HPF (ref 0–10)

## 2016-02-15 ENCOUNTER — Encounter: Payer: Self-pay | Admitting: Family Medicine

## 2016-04-13 ENCOUNTER — Telehealth: Payer: Self-pay | Admitting: Family Medicine

## 2016-04-13 NOTE — Telephone Encounter (Signed)
I was handed an envelope yesterday with 2 health forms for Matthew Henson. One is blank, the other has been completed with cholesterol readings, blood pressure readings, blood sugar reading that must have been done at his work.  I'm not sure if he wants me to complete the other form based on last lab work here, or just to sign form. Please check with patient to see what he needs me to complete. Forms placed in nurse box.

## 2016-04-13 NOTE — Telephone Encounter (Signed)
Dr. Carlota Raspberry,  Please see clarification regarding forms via My Chart.

## 2016-04-13 NOTE — Telephone Encounter (Signed)
Thanks.  This was completed this morning and Rose was finishing up with the forms after I signed my part.

## 2016-04-18 NOTE — Telephone Encounter (Signed)
Spoke with pt and advised it should be coming in the mail in the next couple days.

## 2016-04-18 NOTE — Telephone Encounter (Signed)
Form was put in the mail to pt on 3/16.  It may have not gone out with [facility] until Monday, 3/19.

## 2016-05-14 ENCOUNTER — Ambulatory Visit (INDEPENDENT_AMBULATORY_CARE_PROVIDER_SITE_OTHER): Payer: BLUE CROSS/BLUE SHIELD | Admitting: Family Medicine

## 2016-05-14 VITALS — BP 118/82 | HR 61 | Temp 99.1°F | Resp 16 | Ht 70.0 in | Wt 220.0 lb

## 2016-05-14 DIAGNOSIS — R51 Headache: Secondary | ICD-10-CM | POA: Diagnosis not present

## 2016-05-14 DIAGNOSIS — F329 Major depressive disorder, single episode, unspecified: Secondary | ICD-10-CM

## 2016-05-14 DIAGNOSIS — R519 Headache, unspecified: Secondary | ICD-10-CM

## 2016-05-14 DIAGNOSIS — J069 Acute upper respiratory infection, unspecified: Secondary | ICD-10-CM

## 2016-05-14 DIAGNOSIS — F32A Depression, unspecified: Secondary | ICD-10-CM

## 2016-05-14 DIAGNOSIS — R059 Cough, unspecified: Secondary | ICD-10-CM

## 2016-05-14 DIAGNOSIS — R05 Cough: Secondary | ICD-10-CM

## 2016-05-14 MED ORDER — BENZONATATE 100 MG PO CAPS: 100.0000 mg | ORAL_CAPSULE | Freq: Three times a day (TID) | ORAL | 0 refills | Status: DC | PRN

## 2016-05-14 NOTE — Progress Notes (Signed)
By signing my name below, I, Mesha Guinyard, attest that this documentation has been prepared under the direction and in the presence of Merri Ray, MD.  Electronically Signed: Verlee Monte, Medical Scribe. 05/14/16. 11:25 AM.  Subjective:    Patient ID: Matthew Henson, male    DOB: 12-11-1960, 56 y.o.   MRN: [id number]  HPI Chief Complaint  Patient presents with  . Cough    HPI Comments: Matthew Henson is a 56 y.o. male who presents to the [facility] at [facility] complaining of productive cough and other concerns today onset 4 days ago. PMHx of asthma, exercise induced by chart. He is not on daily asthma medications. Reports associated sxs of chills, heat/cold intolerance, fever, one episode of ear pain 3 days ago, rhinorrhea, sneezing, and sore throat last night. Took nyquil for little relief of his sxs. Reports sick contacts in his house with similar sxs with improvement in 7-10 days. Pt hasn't had a recent flare of asthma and threw out his albuterol inhaler since it expired. Denies wheezing and sleep disturbance.  Depression: See last office visit 02/09/16. He had been changed from welbutrin to prozac at last visit. He was feeling better all around that visit. Did have xanax for break through anxiety sxs. Continued on same dose prozac. Pt is complaint with prozac and he hasn't had to used xanax for his break through sxs. Initially he took it in the morning and experienced a mild HA onset a few weeks ago, "brain fog", and fatigue. He now takes it at night and continues to feel brain fog, and initially experienced insomnia that has now resolved. Took motrin for some relief of his sxs. Pt has seasonal allergies and gets itchy eyes and took benadryl for relief of his sxs but his allergies have been mild this year. Denies diplopia, vision changes, focal weakness, gait problem, and speech difficulty.  Elevating PSA: Enlarged or prominant prostate. Seen by [facility] 10/04/15  Dr. Junious Silk  Pt has not gone to cardiology or urology, but plans on scheduling an appt with them. He still works out when he can.   Patient Active Problem List   Diagnosis Date Noted  . Diverticulosis of colon without hemorrhage 12/21/2014  . Bradycardia 11/17/2013  . Depression 10/09/2011  . 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 Depression   . GERD (gastroesophageal reflux disease)   . Other dyspnea and respiratory abnormality    Presumptive diagnosis of exercised induced asthma   Past Surgical History:  Procedure Laterality Date  . None    . VASECTOMY     No Known Allergies Prior to Admission medications   Medication Sig Start Date End Date Taking? Authorizing Provider  ALPRAZolam Duanne Moron) 0.5 MG tablet take 1 tablet as needed for stress every 8-12 hours Patient not taking: Reported on 02/09/2016 12/15/15   Wendie Agreste, MD  FLUoxetine (PROZAC) 20 MG tablet Take 1 tablet (20 mg total) by mouth daily. 02/09/16   Wendie Agreste, MD   Social History   Social History  . Marital status: Married    Spouse name: N/A  . Number of children: 2  . Years of education: N/A   Occupational History  .  [place] History Main Topics  . Smoking status: Former Research scientist (life sciences)  . Smokeless tobacco: Never Used     Comment: Light smoker years ago.  . Alcohol use 3.6 oz/week    6  Cans of beer per week     Comment: LIGHT BEER  . Drug use: No  . Sexual activity: Yes   Other Topics Concern  . Not on file   Social History Narrative   Lives at home with wife and 2 children.   Stressful job.     Exercise biking and lift weights   Review of Systems  Constitutional: Positive for chills, fatigue and fever.  HENT: Positive for ear pain, rhinorrhea, sneezing and sore throat.   Eyes: Negative for itching and visual disturbance.  Respiratory: Positive for cough. Negative for wheezing.   Endocrine: Positive for cold intolerance and heat  intolerance.  Musculoskeletal: Negative for gait problem.  Neurological: Positive for headaches. Negative for speech difficulty and weakness.  Psychiatric/Behavioral: Positive for sleep disturbance.   Objective:  Physical Exam  Constitutional: He is oriented to person, place, and time. He appears well-developed and well-nourished.  HENT:  Head: Normocephalic and atraumatic.  Right Ear: Tympanic membrane, external ear and ear canal normal.  Left Ear: Tympanic membrane, external ear and ear canal normal.  Nose: No rhinorrhea.  Mouth/Throat: Oropharynx is clear and moist and mucous membranes are normal. No oropharyngeal exudate or posterior oropharyngeal erythema.  Eyes: Conjunctivae are normal. Pupils are equal, round, and reactive to light.  Neck: Neck supple.  Cardiovascular: Normal rate, regular rhythm, normal heart sounds and intact distal pulses.   No murmur heard. Pulmonary/Chest: Effort normal and breath sounds normal. He has no wheezes. He has no rhonchi. He has no rales.  Abdominal: Soft. There is no tenderness.  Lymphadenopathy:    He has no cervical adenopathy.  Tender along the [facility] node, but not enlarged  Neurological: He is alert and oriented to person, place, and time.  Skin: Skin is warm and dry. No rash noted.  Psychiatric: He has a normal mood and affect. His behavior is normal.  Vitals reviewed.   Vitals:   05/14/16 1051 05/14/16 1106  BP: (!) 143/90 118/82  Pulse: 61   Resp: 16   Temp: 99.1 F (37.3 C)   TempSrc: Oral   SpO2: 95%   Weight: 220 lb (99.8 kg)   Height: 5\' 10"  (1.778 m)    Body mass index is 31.57 kg/m. Assessment & Plan:   Matthew Henson is a 56 y.o. male Acute upper respiratory infection Cough - Plan: benzonatate (TESSALON) 100 MG capsule  -Suspected viral illness with possible contribution of seasonal allergies. Continue symptomatic care, Tessalon provided. Over-the-counter Claritin or Zyrtec as needed. RTC precautions  Depression,  unspecified depression type  -Overall stable. Appears to be tolerating Prozac at night better. No change in meds for now, but if worsening foggy head symptoms, or  persistent headaches, recheck in the next 2 weeks.  Nonintractable episodic headache, unspecified headache type  -May be related to allergies/current illness. Trial of Claritin/Zyrtec as above, RTC precautions if symptoms persist. Sooner if worse  History of increasing PSA, advised to follow-up with urologist as other studies may be needed beside CT scanning that I ordered.  Meds ordered this encounter  Medications  . benzonatate (TESSALON) 100 MG capsule    Sig: Take 1 capsule (100 mg total) by mouth 3 (three) times daily as needed for cough.    Dispense:  20 capsule    Refill:  0   Patient Instructions   Claritin or Zyrtec over the counter for possible allergies to see if that may be a cause of the headache/foggy head. If headache symptoms are not improving  within the next 2 weeks of using the medication, follow-up to discuss other causes.  Current cough and other symptoms appear to be due to a virus. See information below. If you're having fevers or other worsening symptoms, or lives or not starting to improve within the next week to 10 days, return for recheck. I did prescribe albuterol inhaler if needed for wheezing, but I do not hear any wheezing at this time.  Tessalon Perles 3 times a day as needed for cough.  Okay to continue Prozac at nighttime if that seems to be working better.   Call urologist office to determine their follow-up plan as it appears they may have wanted to do other studies besides just a CT scan that I ordered in January.  Return to the clinic or go to the nearest emergency room if any of your symptoms worsen or new symptoms occur.      IF you received an x-ray today, you will receive an invoice from [facility]. Please contact [facility] Radiology at [phone] with questions or  concerns regarding your invoice.   IF you received labwork today, you will receive an invoice from [place]. Please contact [facility] at [phone] with questions or concerns regarding your invoice.   Our billing staff will not be able to assist you with questions regarding bills from these companies.  You will be contacted with the lab results as soon as they are available. The fastest way to get your results is to activate your My Chart account. Instructions are located on the last page of this paperwork. If you have not heard from [place] regarding the results in 2 weeks, please contact this office.       I personally performed the services described in this documentation, which was scribed in my presence. The recorded information has been reviewed and considered for accuracy and completeness, addended by me as needed, and agree with information above.  Signed,   Merri Ray, MD [facility] at [place].  05/16/16 11:50 AM

## 2016-05-14 NOTE — Patient Instructions (Addendum)
Claritin or Zyrtec over the counter for possible allergies to see if that may be a cause of the headache/foggy head. If headache symptoms are not improving within the next 2 weeks of using the medication, follow-up to discuss other causes.  Current cough and other symptoms appear to be due to a virus. See information below. If you're having fevers or other worsening symptoms, or lives or not starting to improve within the next week to 10 days, return for recheck. I did prescribe albuterol inhaler if needed for wheezing, but I do not hear any wheezing at this time.  Tessalon Perles 3 times a day as needed for cough.  Okay to continue Prozac at nighttime if that seems to be working better.   Call urologist office to determine their follow-up plan as it appears they may have wanted to do other studies besides just a CT scan that I ordered in January.  Return to the clinic or go to the nearest emergency room if any of your symptoms worsen or new symptoms occur.      IF you received an x-ray today, you will receive an invoice from [facility]. Please contact [facility] Radiology at [phone] with questions or concerns regarding your invoice.   IF you received labwork today, you will receive an invoice from [place]. Please contact [facility] at [phone] with questions or concerns regarding your invoice.   Our billing staff will not be able to assist you with questions regarding bills from these companies.  You will be contacted with the lab results as soon as they are available. The fastest way to get your results is to activate your My Chart account. Instructions are located on the last page of this paperwork. If you have not heard from [place] regarding the results in 2 weeks, please contact this office.

## 2016-05-23 ENCOUNTER — Encounter: Payer: Self-pay | Admitting: Family Medicine

## 2016-05-23 ENCOUNTER — Ambulatory Visit (INDEPENDENT_AMBULATORY_CARE_PROVIDER_SITE_OTHER): Payer: BLUE CROSS/BLUE SHIELD | Admitting: Emergency Medicine

## 2016-05-23 VITALS — BP 138/89 | HR 59 | Temp 98.3°F | Resp 20 | Ht 69.13 in | Wt 215.2 lb

## 2016-05-23 DIAGNOSIS — J069 Acute upper respiratory infection, unspecified: Secondary | ICD-10-CM | POA: Diagnosis not present

## 2016-05-23 DIAGNOSIS — Z131 Encounter for screening for diabetes mellitus: Secondary | ICD-10-CM | POA: Diagnosis not present

## 2016-05-23 DIAGNOSIS — Z13220 Encounter for screening for lipoid disorders: Secondary | ICD-10-CM | POA: Diagnosis not present

## 2016-05-23 LAB — GLUCOSE, POCT (MANUAL RESULT ENTRY): POC GLUCOSE: 90 mg/dL (ref 70–99)

## 2016-05-23 NOTE — Progress Notes (Signed)
Matthew Henson 56 y.o.   Chief Complaint  Patient presents with  . Labs Only    cholesterol  . Paperwork    HISTORY OF PRESENT ILLNESS: This is a 56 y.o. male needs cholesterol bloodwork done and paperwork filled. Recovering from the flu, 10 days into illness, starting to feel better. Flying out tomorrow.  HPI   Prior to Admission medications   Medication Sig Start Date End Date Taking? Authorizing Provider  ALPRAZolam Matthew Henson) 0.5 MG tablet take 1 tablet as needed for stress every 8-12 hours 12/15/15  Yes Wendie Agreste, MD  benzonatate (TESSALON) 100 MG capsule Take 1 capsule (100 mg total) by mouth 3 (three) times daily as needed for cough. 05/14/16  Yes Wendie Agreste, MD  FLUoxetine (PROZAC) 20 MG tablet Take 1 tablet (20 mg total) by mouth daily. 02/09/16  Yes Wendie Agreste, MD    No Known Allergies  Patient Active Problem List   Diagnosis Date Noted  . Diverticulosis of colon without hemorrhage 12/21/2014  . Bradycardia 11/17/2013  . Depression 10/09/2011  . 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 Depression   . GERD (gastroesophageal reflux disease)   . Other dyspnea and respiratory abnormality    Presumptive diagnosis of exercised induced asthma    Past Surgical History:  Procedure Laterality Date  . None    . VASECTOMY      Social History   Social History  . Marital status: Married    Spouse name: N/A  . Number of children: 2  . Years of education: N/A   Occupational History  .  [place] History Main Topics  . Smoking status: Former Research scientist (life sciences)  . Smokeless tobacco: Never Used     Comment: Light smoker years ago.  . Alcohol use 3.6 oz/week    6 Cans of beer per week     Comment: LIGHT BEER  . Drug use: No  . Sexual activity: Yes   Other Topics Concern  . Not on file   Social History Narrative   Lives at home with wife and 2 children.   Stressful job.     Exercise biking and lift  weights    Family History  Problem Relation Age of Onset  . Cancer Father     Throat  . Heart failure Brother 41    Substance abuse  . Cancer Mother      Review of Systems  Constitutional: Negative.   Respiratory: Negative.   Cardiovascular: Negative.   Gastrointestinal: Negative.   Neurological: Negative.   All other systems reviewed and are negative.  Vitals:   05/23/16 1709  BP: 138/89  Pulse: (!) 59  Resp: 20  Temp: 98.3 F (36.8 C)     Physical Exam  Constitutional: He is oriented to person, place, and time. He appears well-developed and well-nourished.  HENT:  Head: Normocephalic and atraumatic.  Eyes: EOM are normal. Pupils are equal, round, and reactive to light.  Neck: Normal range of motion. Neck supple.  Cardiovascular: Normal rate, regular rhythm and normal heart sounds.   Pulmonary/Chest: Effort normal and breath sounds normal.  Abdominal: Soft. There is no tenderness.  Musculoskeletal: Normal range of motion.  Neurological: He is alert and oriented to person, place, and time.  Skin: Skin is dry. Capillary refill takes less than 2 seconds.  Psychiatric: He has a normal mood and affect. His behavior is normal.  Vitals reviewed.  ASSESSMENT & PLAN: [name] was seen today for labs only and paperwork.  Diagnoses and all orders for this visit:  Screening for hyperlipidemia -     Lipid panel  Diabetes mellitus screening -     POCT glucose (manual entry)  Acute upper respiratory infection Comments: resolving   Form filled. To be signed tomorrow when lipid panel's back.   Agustina Caroli, MD [facility]

## 2016-05-23 NOTE — Patient Instructions (Signed)
     IF you received an x-ray today, you will receive an invoice from [facility]. Please contact [facility] at [phone] with questions or concerns regarding your invoice.   IF you received labwork today, you will receive an invoice from [facility]. Please contact [facility] at [phone] with questions or concerns regarding your invoice.   Our billing staff will not be able to assist you with questions regarding bills from these companies.  You will be contacted with the lab results as soon as they are available. The fastest way to get your results is to activate your My Chart account. Instructions are located on the last page of this paperwork. If you have not heard from us regarding the results in 2 weeks, please contact this office.     

## 2016-05-24 ENCOUNTER — Telehealth: Payer: Self-pay

## 2016-05-24 LAB — LIPID PANEL
CHOL/HDL RATIO: 5.6 ratio — AB (ref 0.0–5.0)
Cholesterol, Total: 200 mg/dL — ABNORMAL HIGH (ref 100–199)
HDL: 36 mg/dL — ABNORMAL LOW (ref >39)
LDL CALC: 138 mg/dL — AB (ref 0–99)
TRIGLYCERIDES: 132 mg/dL (ref 0–149)
VLDL CHOLESTEROL CAL: 26 mg/dL (ref 5–40)

## 2016-05-24 NOTE — Telephone Encounter (Signed)
See email.  [place] for for lab only visit, but check to see what other labs needed and order for me if possible. Windell Hummingbird may also be also be able to help as I am out of town. Thanks.

## 2016-05-24 NOTE — Telephone Encounter (Signed)
Pt left form  "Provider Screening Form" from [facility] employer.  Completed by Dr. Mitchel Honour, scanned for chart, faxed to company [phone].  Pt called and LMOVM for him to pick up at front desk as requested.

## 2016-06-07 ENCOUNTER — Other Ambulatory Visit: Payer: Self-pay | Admitting: Family Medicine

## 2016-06-07 DIAGNOSIS — R05 Cough: Secondary | ICD-10-CM

## 2016-06-07 DIAGNOSIS — R059 Cough, unspecified: Secondary | ICD-10-CM

## 2016-06-08 MED ORDER — BENZONATATE 100 MG PO CAPS: 100.0000 mg | ORAL_CAPSULE | Freq: Three times a day (TID) | ORAL | 0 refills | Status: DC | PRN

## 2016-06-08 NOTE — Telephone Encounter (Signed)
My Chart msg to refill Tessalon.  Refilled with msg to return to clinic if cough persists.

## 2016-08-20 ENCOUNTER — Encounter: Payer: Self-pay | Admitting: Family Medicine

## 2016-08-20 DIAGNOSIS — F418 Other specified anxiety disorders: Secondary | ICD-10-CM

## 2016-08-20 DIAGNOSIS — F439 Reaction to severe stress, unspecified: Secondary | ICD-10-CM

## 2016-08-21 ENCOUNTER — Other Ambulatory Visit: Payer: Self-pay

## 2016-08-21 DIAGNOSIS — F439 Reaction to severe stress, unspecified: Secondary | ICD-10-CM

## 2016-08-21 DIAGNOSIS — F418 Other specified anxiety disorders: Secondary | ICD-10-CM

## 2016-08-21 MED ORDER — FLUOXETINE HCL 20 MG PO TABS: 20.0000 mg | ORAL_TABLET | Freq: Every day | ORAL | 1 refills | Status: DC

## 2016-08-21 MED ORDER — ALPRAZOLAM 0.5 MG PO TABS: ORAL_TABLET | ORAL | 1 refills | Status: DC

## 2016-10-18 NOTE — Progress Notes (Signed)
Cardiology Office Note   Date:  10/22/2016   ID:  Matthew Henson, DOB Mar 16, 1960, MRN [id number]  PCP:  Wendie Agreste, MD  Cardiologist:   Minus Breeding, MD  Referring:  Wendie Agreste, MD  Chief Complaint  Patient presents with  . Chest Pain      History of Present Illness: Matthew Henson is a 56 y.o. male who is referred by Wendie Agreste, MD for evaluation of chest pain.  I saw him in 2013 for dyspnea and chest pain.   He was supposed to have a POET (Plain Old Exercise Treadmill).   However, he didn't have this at that time. Instead he will work on stress and anxiety. He presents now to discuss chest discomfort. This is something that he notices sporadically. It 5 out of 10 in intensity. It's a left-sided discomfort when it happens. It happens at rest. It might go on for hours. He doesn't really notice when he goes away. He can't make it happen. He can pedal a bicycle vigorously without bringing this on. He exercises routinely without the symptoms. He notices it more in the evening. He's not noticed any associated symptoms such as nausea vomiting or diaphoresis. He's had no palpitations, presyncope or syncope.  Past Medical History:  Diagnosis Date  . Depression   . GERD (gastroesophageal reflux disease)   . Other dyspnea and respiratory abnormality    Presumptive diagnosis of exercised induced asthma    Past Surgical History:  Procedure Laterality Date  . None    . VASECTOMY       Current Outpatient Prescriptions  Medication Sig Dispense Refill  . ALPRAZolam (XANAX) 0.5 MG tablet take 1 tablet as needed for stress every 8-12 hours 20 tablet 1  . FLUoxetine (PROZAC) 20 MG tablet Take 1 tablet (20 mg total) by mouth daily. 90 tablet 1   No current facility-administered medications for this visit.     Allergies:   Patient has no known allergies.    Social History:  The patient  reports that he has never smoked. He has never used smokeless tobacco. He reports that  he drinks about 3.6 oz of alcohol per week . He reports that he does not use drugs.   Family History:  The patient's family history includes Cancer in his father and mother; Heart failure (age of onset: 44) in his brother.    ROS:  Please see the history of present illness.   Otherwise, review of systems are positive for none.   All other systems are reviewed and negative.    PHYSICAL EXAM: VS:  BP 114/84   Pulse (!) 47   Ht 5\' 10"  (1.778 m)   Wt 214 lb (97.1 kg)   BMI 30.71 kg/m  , BMI Body mass index is 30.71 kg/m. GENERAL:  Well appearing HEENT:  Pupils equal round and reactive, fundi not visualized, oral mucosa unremarkable NECK:  No jugular venous distention, waveform within normal limits, carotid upstroke brisk and symmetric, no bruits, no thyromegaly LYMPHATICS:  No cervical, inguinal adenopathy LUNGS:  Clear to auscultation bilaterally BACK:  No CVA tenderness CHEST:  Unremarkable HEART:  PMI not displaced or sustained,S1 and S2 within normal limits, no S3, no S4, no clicks, no rubs, no murmurs ABD:  Flat, positive bowel sounds normal in frequency in pitch, no bruits, no rebound, no guarding, no midline pulsatile mass, no hepatomegaly, no splenomegaly EXT:  2 plus pulses throughout, no edema, no cyanosis no clubbing SKIN:  No rashes no nodules NEURO:  Cranial nerves II through XII grossly intact, motor grossly intact throughout PSYCH:  Cognitively intact, oriented to person place and time    EKG:  EKG is ordered today. The ekg ordered today demonstrates sinus bradycardia, rate 47, axis within normal limits, intervals within normal limits, no acute ST-T wave changes.   Recent Labs: 12/15/2015: ALT 23; BUN 19; Creat 1.05; Potassium 4.6; Sodium 140; TSH 2.02 02/09/2016: Hemoglobin 15.3; Platelets 221    Lipid Panel    Component Value Date/Time   CHOL 200 (H) 05/23/2016 1723   TRIG 132 05/23/2016 1723   HDL 36 (L) 05/23/2016 1723   CHOLHDL 5.6 (H) 05/23/2016 1723    CHOLHDL 4.2 12/15/2015 0940   VLDL 22 12/15/2015 0940   LDLCALC 138 (H) 05/23/2016 1723      Wt Readings from Last 3 Encounters:  10/19/16 214 lb (97.1 kg)  05/23/16 215 lb 3.2 oz (97.6 kg)  05/14/16 220 lb (99.8 kg)      Other studies Reviewed: Additional studies/ records that were reviewed today include: None. Review of the above records demonstrates:  Please see elsewhere in the note.     ASSESSMENT AND PLAN:   CHEST PAIN:  The chest pain is atypical. I am going to start with a coronary calcium score.  Further evaluation and treatment will be based on these results. In I had a long discussion about risk reduction.  BRADYCARDIA:  He is not having symptoms related to this.  No change in therapy is indicated.     DYSLIPIDEMIA:  He has mildly abnormal lipids and at present would not be criteria for either aspirin or statin. His LDL was 138 and HDL 36. However, data from the above will guide further targets.  Current medicines are reviewed at length with the patient today.  The patient does not have concerns regarding medicines.  The following changes have been made:  no change  Labs/ tests ordered today include:   Orders Placed This Encounter  Procedures  . CT CARDIAC SCORING  . EKG 12-Lead     Disposition:   FU with me as needed.      Signed, Minus Breeding, MD  10/22/2016 12:40 PM    [place]

## 2016-10-19 ENCOUNTER — Ambulatory Visit (INDEPENDENT_AMBULATORY_CARE_PROVIDER_SITE_OTHER): Payer: BLUE CROSS/BLUE SHIELD | Admitting: Cardiology

## 2016-10-19 ENCOUNTER — Encounter: Payer: Self-pay | Admitting: Cardiology

## 2016-10-19 VITALS — BP 114/84 | HR 47 | Ht 70.0 in | Wt 214.0 lb

## 2016-10-19 DIAGNOSIS — R001 Bradycardia, unspecified: Secondary | ICD-10-CM | POA: Diagnosis not present

## 2016-10-19 DIAGNOSIS — E785 Hyperlipidemia, unspecified: Secondary | ICD-10-CM | POA: Diagnosis not present

## 2016-10-19 DIAGNOSIS — R079 Chest pain, unspecified: Secondary | ICD-10-CM

## 2016-10-19 NOTE — Patient Instructions (Addendum)
Medication Instructions:  Continue current medications  If you need a refill on your cardiac medications before your next appointment, please call your pharmacy.  Labwork: None Ordered   Testing/Procedures: Your physician has requested that you have a Coronary Calcium Score. This test is done at our [address] Office.   Follow-Up: Your physician wants you to follow-up in: As Needed.      Thank you for choosing [facility]!!       

## 2016-10-22 ENCOUNTER — Encounter: Payer: Self-pay | Admitting: Cardiology

## 2016-10-25 ENCOUNTER — Telehealth: Payer: Self-pay | Admitting: *Deleted

## 2016-10-25 NOTE — Telephone Encounter (Signed)
Left message for patient to call regarding Calcium Scoring ordered by Dr. Percival Spanish.  Per [facility]---we do not call insurance about calcium scoring.  I will relay this to the patient .

## 2016-10-31 ENCOUNTER — Ambulatory Visit (INDEPENDENT_AMBULATORY_CARE_PROVIDER_SITE_OTHER)
Admission: RE | Admit: 2016-10-31 | Discharge: 2016-10-31 | Disposition: A | Discharge status: Home / Self Care | Payer: Self-pay | Source: Ambulatory Visit | Attending: Cardiology | Admitting: Cardiology

## 2016-10-31 DIAGNOSIS — R079 Chest pain, unspecified: Secondary | ICD-10-CM

## 2016-11-05 ENCOUNTER — Telehealth: Payer: Self-pay | Admitting: *Deleted

## 2016-11-05 DIAGNOSIS — R079 Chest pain, unspecified: Secondary | ICD-10-CM

## 2016-11-05 NOTE — Telephone Encounter (Signed)
-----   Message from Minus Breeding, MD sent at 11/04/2016  1:38 PM EDT ----- Discussed with patient.  Please schedule a POET (Plain Old Exercise Treadmill).  Send results to Wendie Agreste, MD

## 2016-11-05 NOTE — Telephone Encounter (Signed)
GXT ordered and send to scheduler to be schedule 

## 2016-11-14 ENCOUNTER — Telehealth (HOSPITAL_COMMUNITY): Payer: Self-pay

## 2016-11-14 NOTE — Telephone Encounter (Signed)
Encounter complete. 

## 2016-11-15 ENCOUNTER — Encounter (HOSPITAL_COMMUNITY): Payer: BLUE CROSS/BLUE SHIELD

## 2016-11-15 ENCOUNTER — Telehealth (HOSPITAL_COMMUNITY): Payer: Self-pay

## 2016-11-15 NOTE — Telephone Encounter (Signed)
Encounter complete. 

## 2016-11-16 ENCOUNTER — Ambulatory Visit (HOSPITAL_COMMUNITY)
Admission: RE | Admit: 2016-11-16 | Discharge: 2016-11-16 | Disposition: A | Discharge status: Home / Self Care | Payer: BLUE CROSS/BLUE SHIELD | Source: Ambulatory Visit | Attending: Cardiovascular Disease | Admitting: Cardiovascular Disease

## 2016-11-16 DIAGNOSIS — R079 Chest pain, unspecified: Secondary | ICD-10-CM | POA: Diagnosis not present

## 2016-11-16 LAB — EXERCISE TOLERANCE TEST
CHL CUP RESTING HR STRESS: 46 {beats}/min
CSEPED: 13 min
CSEPEDS: 0 s
CSEPEW: 15.3 METS
CSEPPHR: 157 {beats}/min
MPHR: 166 {beats}/min
Percent HR: 94 %
RPE: 17

## 2016-11-20 ENCOUNTER — Encounter: Payer: Self-pay | Admitting: Cardiology

## 2016-11-22 ENCOUNTER — Encounter: Payer: Self-pay | Admitting: Family Medicine

## 2016-11-22 ENCOUNTER — Ambulatory Visit (INDEPENDENT_AMBULATORY_CARE_PROVIDER_SITE_OTHER): Payer: BLUE CROSS/BLUE SHIELD | Admitting: Family Medicine

## 2016-11-22 DIAGNOSIS — F418 Other specified anxiety disorders: Secondary | ICD-10-CM | POA: Diagnosis not present

## 2016-11-22 DIAGNOSIS — F439 Reaction to severe stress, unspecified: Secondary | ICD-10-CM

## 2016-11-22 MED ORDER — FLUOXETINE HCL 20 MG PO TABS: 40.0000 mg | ORAL_TABLET | Freq: Every day | ORAL | 1 refills | Status: DC

## 2016-11-22 MED ORDER — ALPRAZOLAM 0.5 MG PO TABS: ORAL_TABLET | ORAL | 1 refills | Status: DC

## 2016-11-22 NOTE — Patient Instructions (Addendum)
I think it is reasonable to wait on statin medication, and recheck lipids in 6 months.   See information on stress below. You can try higher dose of prozac at 62m temporarily to see if that improves anxiety symptoms. No change in xanax for now.   Stress and Stress Management Stress is a normal reaction to life events. It is what you feel when life demands more than you are used to or more than you can handle. Some stress can be useful. For example, the stress reaction can help you catch the last bus of the day, study for a test, or meet a deadline at work. But stress that occurs too often or for too long can cause problems. It can affect your emotional health and interfere with relationships and normal daily activities. Too much stress can weaken your immune system and increase your risk for physical illness. If you already have a medical problem, stress can make it worse. What are the causes? All sorts of life events may cause stress. An event that causes stress for one person may not be stressful for another person. Major life events commonly cause stress. These may be positive or negative. Examples include losing your job, moving into a new home, getting married, having a baby, or losing a loved one. Less obvious life events may also cause stress, especially if they occur day after day or in combination. Examples include working long hours, driving in traffic, caring for children, being in debt, or being in a difficult relationship. What are the signs or symptoms? Stress may cause emotional symptoms including, the following:  Anxiety. This is feeling worried, afraid, on edge, overwhelmed, or out of control.  Anger. This is feeling irritated or impatient.  Depression. This is feeling sad, down, helpless, or guilty.  Difficulty focusing, remembering, or making decisions.  Stress may cause physical symptoms, including the following:  Aches and pains. These may affect your head, neck, back,  stomach, or other areas of your body.  Tight muscles or clenched jaw.  Low energy or trouble sleeping.  Stress may cause unhealthy behaviors, including the following:  Eating to feel better (overeating) or skipping meals.  Sleeping too little, too much, or both.  Working too much or putting off tasks (procrastination).  Smoking, drinking alcohol, or using drugs to feel better.  How is this diagnosed? Stress is diagnosed through an assessment by your health care provider. Your health care provider will ask questions about your symptoms and any stressful life events.Your health care provider will also ask about your medical history and may order blood tests or other tests. Certain medical conditions and medicine can cause physical symptoms similar to stress. Mental illness can cause emotional symptoms and unhealthy behaviors similar to stress. Your health care provider may refer you to a mental health professional for further evaluation. How is this treated? Stress management is the recommended treatment for stress.The goals of stress management are reducing stressful life events and coping with stress in healthy ways. Techniques for reducing stressful life events include the following:  Stress identification. Self-monitor for stress and identify what causes stress for you. These skills may help you to avoid some stressful events.  Time management. Set your priorities, keep a calendar of events, and learn to say "no." These tools can help you avoid making too many commitments.  Techniques for coping with stress include the following:  Rethinking the problem. Try to think realistically about stressful events rather than ignoring them or overreacting. Try  to find the positives in a stressful situation rather than focusing on the negatives.  Exercise. Physical exercise can release both physical and emotional tension. The key is to find a form of exercise you enjoy and do it  regularly.  Relaxation techniques. These relax the body and mind. Examples include yoga, meditation, tai chi, biofeedback, deep breathing, progressive muscle relaxation, listening to music, being out in nature, journaling, and other hobbies. Again, the key is to find one or more that you enjoy and can do regularly.  Healthy lifestyle. Eat a balanced diet, get plenty of sleep, and do not smoke. Avoid using alcohol or drugs to relax.  Strong support network. Spend time with family, friends, or other people you enjoy being around.Express your feelings and talk things over with someone you trust.  Counseling or talktherapy with a mental health professional may be helpful if you are having difficulty managing stress on your own. Medicine is typically not recommended for the treatment of stress.Talk to your health care provider if you think you need medicine for symptoms of stress. Follow these instructions at home:  Keep all follow-up visits as directed by your health care provider.  Take all medicines as directed by your health care provider. Contact a health care provider if:  Your symptoms get worse or you start having new symptoms.  You feel overwhelmed by your problems and can no longer manage them on your own. Get help right away if:  You feel like hurting yourself or someone else. This information is not intended to replace advice given to you by your health care provider. Make sure you discuss any questions you have with your health care provider. Document Released: 07/11/2000 Document Revised: 06/23/2015 Document Reviewed: 09/09/2012 Elsevier Interactive Patient Education  2017 [employer].    IF you received an x-ray today, you will receive an invoice from [facility]. Please contact [facility] at [phone] with questions or concerns regarding your invoice.   IF you received labwork today, you will receive an invoice from [facility]. Please contact [facility] at  [phone] with questions or concerns regarding your invoice.   Our billing staff will not be able to assist you with questions regarding bills from these companies.  You will be contacted with the lab results as soon as they are available. The fastest way to get your results is to activate your My Chart account. Instructions are located on the last page of this paperwork. If you have not heard from [place] regarding the results in 2 weeks, please contact this office.

## 2016-11-22 NOTE — Progress Notes (Signed)
Subjective:  This chart was scribed for Wendie Agreste, MD by Tamsen Roers, at [place] at [facility].  This patient was seen in room 10 and the patient's care was started at 11:20 AM.   Chief Complaint  Patient presents with  . Medication Refill    Xanax 0.5 MG and Prozac 20 MG, Pt requests for 90 day supply     Patient ID: Matthew Henson, male    DOB: [birth year], 56 y.o.   MRN: [id number]  HPI HPI Comments: Matthew Henson is a 56 y.o. male who presents to Primary Care at [facility] for a follow up.   Depression with anxiety: He was changed from Wellbutrin to Prozac in January with Xanax as needed for breakthrough symptoms. Overall was doing better in April. Felt like Prozac at night time seemed to work better. Last prescription for Xanax - #20 with refill in July.--- Patient has not had any changes since his last visit.  He takes Prozac (in the mornings) and states that when he does, he has feelings of anxiousness/stress instead of depression.  Patient describes his anxiousness and stress as "self imposed" and feels like the Prozac has really helped him. He takes the Xanax about 1-2 times per week and describes this as a "last resort" medication.  When he is not able to sleep at night, patient takes a Benadryl as this also helps with his allergies and clearing up his sinuses.  His father in law had a heart attack three days ago and patient was the first one to arrive on the scene and do CPR.  He had a stress test completed last week which came back with no concerns.  He still feels fatigued at times and states that he has come to "accept this as a part of life".  He manages stress by exercising (a couple times per week- changes based on work schedule), drinks some beers and relaxes when he can.  He has been the traveling last couple of weeks ([place] and [place]) and states that he doesn't have much time to exercise on these days as they are about 13-14 hours long.  Patient has not tried  therapy in the past and has no interest in doing so.   He is complaining of a soreness on the left side of his neck today.  Denies sore throat/congestion.     Patient Active Problem List   Diagnosis Date Noted  . Screening for hyperlipidemia 05/23/2016  . Diverticulosis of colon without hemorrhage 12/21/2014  . Bradycardia 11/17/2013  . Depression 10/09/2011  . 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 Depression   . GERD (gastroesophageal reflux disease)   . Other dyspnea and respiratory abnormality    Presumptive diagnosis of exercised induced asthma   Past Surgical History:  Procedure Laterality Date  . None    . VASECTOMY     No Known Allergies Prior to Admission medications   Medication Sig Start Date End Date Taking? Authorizing Provider  ALPRAZolam Duanne Moron) 0.5 MG tablet take 1 tablet as needed for stress every 8-12 hours 08/21/16   Wendie Agreste, MD  FLUoxetine (PROZAC) 20 MG tablet Take 1 tablet (20 mg total) by mouth daily. 08/21/16   Wendie Agreste, MD   Social History   Social History  . Marital status: Married    Spouse name: N/A  . Number of children: 2  . Years of education: N/A   Occupational History  .  [employer]   Social History Main Topics  . Smoking status: Never Smoker  . Smokeless tobacco: Never Used     Comment: Light smoker years ago.  . Alcohol use 3.6 oz/week    6 Cans of beer per week     Comment: LIGHT BEER  . Drug use: No  . Sexual activity: Yes   Other Topics Concern  . Not on file   Social History Narrative   Lives at home with wife and 2 children.   Stressful job.     Exercise biking and lift weights      Review of Systems  Constitutional: Negative for chills and fever.  HENT: Negative for congestion and sore throat.   Eyes: Negative for pain and redness.  Respiratory: Negative for cough, choking and shortness of breath.   Gastrointestinal: Negative for nausea and vomiting.    Musculoskeletal: Negative for neck pain and neck stiffness.  Neurological: Negative for syncope and speech difficulty.       Objective:   Physical Exam  Constitutional: He is oriented to person, place, and time. He appears well-developed and well-nourished. No distress.  HENT:  Head: Normocephalic and atraumatic.  Right Ear: Tympanic membrane, external ear and ear canal normal.  Left Ear: Tympanic membrane, external ear and ear canal normal.  Nose: No rhinorrhea.  Mouth/Throat: Oropharynx is clear and moist and mucous membranes are normal. No oropharyngeal exudate or posterior oropharyngeal erythema.  Moist oral mucosa, no exudate.   Eyes: Pupils are equal, round, and reactive to light. Conjunctivae are normal.  Neck: Neck supple.  No swollen lymph nodes, some tenderness along the [facility] of the left.   Cardiovascular: Normal rate, regular rhythm, normal heart sounds and intact distal pulses.   No murmur heard. Pulmonary/Chest: Effort normal and breath sounds normal. No respiratory distress. He has no wheezes. He has no rhonchi. He has no rales.  Abdominal: Soft. There is no tenderness.  Musculoskeletal: Normal range of motion.  Lymphadenopathy:    He has no cervical adenopathy.  Neurological: He is alert and oriented to person, place, and time.  Skin: Skin is warm and dry. No rash noted.  Psychiatric: He has a normal mood and affect. His behavior is normal.  Vitals reviewed.   Vitals:   11/22/16 1059  BP: 128/80  Pulse: (!) 50  Resp: 18  Temp: 97.8 F (36.6 C)  TempSrc: Oral  SpO2: 99%  Weight: 220 lb (99.8 kg)  Height: '5\' 10"'  (1.778 m)         Assessment & Plan:   Matthew Henson is a 56 y.o. male Stress - Plan: FLUoxetine (PROZAC) 20 MG tablet, ALPRAZolam (XANAX) 0.5 MG tablet  Depression with anxiety - Plan: FLUoxetine (PROZAC) 20 MG tablet  -Some persistent anxiety symptoms, even prior to stressful/traumatic event earlier this week. Stress management techniques and  coping techniques discussed. We discussed options for current medications, and he thinks that he has had some benefit with Prozac 20 mg, decided to try slightly higher dose of 40 mg. If he is intolerant to side effects at that dose, return to 20 mg. Xanax if needed, refilled today.  - Handout given on stress management techniques, continue exercise, other relaxation techniques, option of counseling given at some point if he would like, but declined at present.  -Recheck 6 months. Can update me through my chart message in the next few weeks  Meds ordered this encounter  Medications  . FLUoxetine (PROZAC) 20 MG tablet    Sig:  Take 2 tablets (40 mg total) by mouth daily.    Dispense:  180 tablet    Refill:  1  . ALPRAZolam (XANAX) 0.5 MG tablet    Sig: take 1 tablet as needed for stress every 8-12 hours    Dispense:  20 tablet    Refill:  1   Patient Instructions   I think it is reasonable to wait on statin medication, and recheck lipids in 6 months.   See information on stress below. You can try higher dose of prozac at 24m temporarily to see if that improves anxiety symptoms. No change in xanax for now.   Stress and Stress Management Stress is a normal reaction to life events. It is what you feel when life demands more than you are used to or more than you can handle. Some stress can be useful. For example, the stress reaction can help you catch the last bus of the day, study for a test, or meet a deadline at work. But stress that occurs too often or for too long can cause problems. It can affect your emotional health and interfere with relationships and normal daily activities. Too much stress can weaken your immune system and increase your risk for physical illness. If you already have a medical problem, stress can make it worse. What are the causes? All sorts of life events may cause stress. An event that causes stress for one person may not be stressful for another person. Major life  events commonly cause stress. These may be positive or negative. Examples include losing your job, moving into a new home, getting married, having a baby, or losing a loved one. Less obvious life events may also cause stress, especially if they occur day after day or in combination. Examples include working long hours, driving in traffic, caring for children, being in debt, or being in a difficult relationship. What are the signs or symptoms? Stress may cause emotional symptoms including, the following:  Anxiety. This is feeling worried, afraid, on edge, overwhelmed, or out of control.  Anger. This is feeling irritated or impatient.  Depression. This is feeling sad, down, helpless, or guilty.  Difficulty focusing, remembering, or making decisions.  Stress may cause physical symptoms, including the following:  Aches and pains. These may affect your head, neck, back, stomach, or other areas of your body.  Tight muscles or clenched jaw.  Low energy or trouble sleeping.  Stress may cause unhealthy behaviors, including the following:  Eating to feel better (overeating) or skipping meals.  Sleeping too little, too much, or both.  Working too much or putting off tasks (procrastination).  Smoking, drinking alcohol, or using drugs to feel better.  How is this diagnosed? Stress is diagnosed through an assessment by your health care provider. Your health care provider will ask questions about your symptoms and any stressful life events.Your health care provider will also ask about your medical history and may order blood tests or other tests. Certain medical conditions and medicine can cause physical symptoms similar to stress. Mental illness can cause emotional symptoms and unhealthy behaviors similar to stress. Your health care provider may refer you to a mental health professional for further evaluation. How is this treated? Stress management is the recommended treatment for stress.The  goals of stress management are reducing stressful life events and coping with stress in healthy ways. Techniques for reducing stressful life events include the following:  Stress identification. Self-monitor for stress and identify what causes stress for you.  These skills may help you to avoid some stressful events.  Time management. Set your priorities, keep a calendar of events, and learn to say "no." These tools can help you avoid making too many commitments.  Techniques for coping with stress include the following:  Rethinking the problem. Try to think realistically about stressful events rather than ignoring them or overreacting. Try to find the positives in a stressful situation rather than focusing on the negatives.  Exercise. Physical exercise can release both physical and emotional tension. The key is to find a form of exercise you enjoy and do it regularly.  Relaxation techniques. These relax the body and mind. Examples include yoga, meditation, tai chi, biofeedback, deep breathing, progressive muscle relaxation, listening to music, being out in nature, journaling, and other hobbies. Again, the key is to find one or more that you enjoy and can do regularly.  Healthy lifestyle. Eat a balanced diet, get plenty of sleep, and do not smoke. Avoid using alcohol or drugs to relax.  Strong support network. Spend time with family, friends, or other people you enjoy being around.Express your feelings and talk things over with someone you trust.  Counseling or talktherapy with a mental health professional may be helpful if you are having difficulty managing stress on your own. Medicine is typically not recommended for the treatment of stress.Talk to your health care provider if you think you need medicine for symptoms of stress. Follow these instructions at home:  Keep all follow-up visits as directed by your health care provider.  Take all medicines as directed by your health care  provider. Contact a health care provider if:  Your symptoms get worse or you start having new symptoms.  You feel overwhelmed by your problems and can no longer manage them on your own. Get help right away if:  You feel like hurting yourself or someone else. This information is not intended to replace advice given to you by your health care provider. Make sure you discuss any questions you have with your health care provider. Document Released: 07/11/2000 Document Revised: 06/23/2015 Document Reviewed: 09/09/2012 Elsevier Interactive Patient Education  2017 [employer].    IF you received an x-ray today, you will receive an invoice from [facility]. Please contact [facility] at [phone] with questions or concerns regarding your invoice.   IF you received labwork today, you will receive an invoice from [facility]. Please contact [facility] at [phone] with questions or concerns regarding your invoice.   Our billing staff will not be able to assist you with questions regarding bills from these companies.  You will be contacted with the lab results as soon as they are available. The fastest way to get your results is to activate your My Chart account. Instructions are located on the last page of this paperwork. If you have not heard from [place] regarding the results in 2 weeks, please contact this office.      I personally performed the services described in this documentation, which was scribed in my presence. The recorded information has been reviewed and considered for accuracy and completeness, addended by me as needed, and agree with information above.  Signed,   Merri Ray, MD [facility] at [place].  11/22/16 2:33 PM

## 2017-03-28 DIAGNOSIS — N401 Enlarged prostate with lower urinary tract symptoms: Secondary | ICD-10-CM | POA: Diagnosis not present

## 2017-03-28 DIAGNOSIS — R350 Frequency of micturition: Secondary | ICD-10-CM | POA: Diagnosis not present

## 2017-04-30 ENCOUNTER — Encounter: Payer: BLUE CROSS/BLUE SHIELD | Admitting: Family Medicine

## 2017-05-07 ENCOUNTER — Other Ambulatory Visit: Payer: Self-pay

## 2017-05-07 ENCOUNTER — Ambulatory Visit (INDEPENDENT_AMBULATORY_CARE_PROVIDER_SITE_OTHER): Payer: BLUE CROSS/BLUE SHIELD | Admitting: Family Medicine

## 2017-05-07 ENCOUNTER — Encounter: Payer: Self-pay | Admitting: Family Medicine

## 2017-05-07 VITALS — BP 120/78 | HR 56 | Temp 98.4°F | Ht 70.0 in | Wt 221.6 lb

## 2017-05-07 DIAGNOSIS — F439 Reaction to severe stress, unspecified: Secondary | ICD-10-CM | POA: Diagnosis not present

## 2017-05-07 DIAGNOSIS — B351 Tinea unguium: Secondary | ICD-10-CM | POA: Diagnosis not present

## 2017-05-07 DIAGNOSIS — Z0000 Encounter for general adult medical examination without abnormal findings: Secondary | ICD-10-CM

## 2017-05-07 DIAGNOSIS — Z23 Encounter for immunization: Secondary | ICD-10-CM | POA: Diagnosis not present

## 2017-05-07 DIAGNOSIS — E785 Hyperlipidemia, unspecified: Secondary | ICD-10-CM

## 2017-05-07 DIAGNOSIS — F418 Other specified anxiety disorders: Secondary | ICD-10-CM | POA: Diagnosis not present

## 2017-05-07 MED ORDER — FLUOXETINE HCL 20 MG PO TABS: 40.0000 mg | ORAL_TABLET | Freq: Every day | ORAL | 1 refills | Status: DC

## 2017-05-07 MED ORDER — EFINACONAZOLE 10 % EX SOLN: CUTANEOUS | 3 refills | Status: DC

## 2017-05-07 MED ORDER — ZOSTER VAC RECOMB ADJUVANTED 50 MCG/0.5ML IM SUSR: 0.5000 mL | Freq: Once | INTRAMUSCULAR | 1 refills | Status: AC

## 2017-05-07 NOTE — Progress Notes (Signed)
Subjective:  By signing my name below, I, Moises Blood, attest that this documentation has been prepared under the direction and in the presence of Merri Ray, MD. Electronically Signed: Moises Blood, [place]. 05/07/2017 , 2:31 PM .  Patient was seen in Room 1 .   Patient ID: Matthew Henson, male    DOB: October 18, 1960, 57 y.o.   MRN: [id number] Chief Complaint  Patient presents with  . Annual Exam    CPE (Need paperwork for work)   HPI Matthew Henson is a 57 y.o. male Here for annual physical. He has a history of depression, asthma and bradycardia. He is fasting today.   Trouble sleeping He reports difficulty staying asleep, usually when he travels for work. He's tried Ambien years ago, and denies having side effects with it. He hasn't tried Xanax for trouble sleeping yet.   Toenail fungus He had taken Lamisil in the past, but doesn't want to take oral medications. He wants to try topical medication.   Depression  Patient is taking Prozac 40mg  QD with episodic xanax. When last discussed in Oct, he was taking xanax 1-2 times per week at that time. There were situational stressors including performing CPR on his father-in-law. He was exercising a few times per week. We had increased his Prozac to 40mg  QD at that time and Xanax PRN. Handout on stress management provided.   He states he's doing well on Prozac 40mg  QD. His father-in-law is also doing well. He still has Xanax tablets at home.   Depression screen [facility] 2/9 05/07/2017 11/22/2016 05/23/2016 05/14/2016 02/09/2016  Decreased Interest 0 0 0 0 0  Down, Depressed, Hopeless 0 0 0 0 0  PHQ - 2 Score 0 0 0 0 0  Altered sleeping - - - - -  Tired, decreased energy - - - - -  Change in appetite - - - - -  Feeling bad or failure about yourself  - - - - -  Trouble concentrating - - - - -  Moving slowly or fidgety/restless - - - - -  Suicidal thoughts - - - - -  PHQ-9 Score - - - - -    Hyperlipidemia Lab Results  Component Value Date   CHOL  200 (H) 05/23/2016   HDL 36 (L) 05/23/2016   LDLCALC 138 (H) 05/23/2016   TRIG 132 05/23/2016   CHOLHDL 5.6 (H) 05/23/2016   Lab Results  Component Value Date   ALT 23 12/15/2015   AST 25 12/15/2015   ALKPHOS 58 12/15/2015   BILITOT 0.5 12/15/2015   He is currently not on medications. He was seen by Dr. Percival Spanish in Sept 2018.   Cancer Screening Colonoscopy: last done in Jan 2009.  Prostate cancer screening: Lab Results  Component Value Date   PSA 2.73 12/21/2014   PSA 2.17 11/17/2013   PSA 1.81 11/04/2012   He did have a slight increase from 1.8 to 2.7, from 2014 through 2016. He is followed by [facility], Dr. Junious Silk, for enlarged prostate. PSA was reportedly 2.68 in Sept 2017, and CT done in Jan 2018 was benign, 50g prostate. Appears his PSA was up to 3.16 in June 2017. He has annual visits with urologist.   Immunizations Immunization History  Administered Date(s) Administered  . Influenza,inj,Quad PF,6+ Mos 12/15/2015  . Td 01/29/1993   Tetanus vaccine: doesn't recall he's updated it in the past 10 years; agrees to it today.  Shingles vaccine: agrees to it, prescription sent in.   Vision  Visual Acuity Screening   Right eye Left eye Both eyes  Without correction: 20/20 20/30 20/20   With correction:      Eye doctor: typically seen once a year; due for an appointment.   Dentist Followed by dentist, seen about twice a year.   Exercise He's exercising about 5 days a week.   Patient Active Problem List   Diagnosis Date Noted  . Screening for hyperlipidemia 05/23/2016  . Diverticulosis of colon without hemorrhage 12/21/2014  . Bradycardia 11/17/2013  . Depression 10/09/2011  . 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 Depression   . GERD (gastroesophageal reflux disease)   . Other dyspnea and respiratory abnormality    Presumptive diagnosis of exercised induced asthma   Past Surgical History:  Procedure  Laterality Date  . None    . VASECTOMY     No Known Allergies Prior to Admission medications   Medication Sig Start Date End Date Taking? Authorizing Provider  ALPRAZolam Duanne Moron) 0.5 MG tablet take 1 tablet as needed for stress every 8-12 hours 11/22/16   Wendie Agreste, MD  FLUoxetine (PROZAC) 20 MG tablet Take 2 tablets (40 mg total) by mouth daily. 11/22/16   Wendie Agreste, MD   Social History   Socioeconomic History  . Marital status: Married    Spouse name: Not on file  . Number of children: 2  . Years of education: Not on file  . Highest education level: Not on file  Occupational History    Comment: [place]  . Financial resource strain: Not on file  . Food insecurity:    Worry: Not on file    Inability: Not on file  . Transportation needs:    Medical: Not on file    Non-medical: Not on file  Tobacco Use  . Smoking status: Never Smoker  . Smokeless tobacco: Never Used  . Tobacco comment: Light smoker years ago.  Substance and Sexual Activity  . Alcohol use: Yes    Alcohol/week: 3.6 oz    Types: 6 Cans of beer per week    Comment: LIGHT BEER  . Drug use: No  . Sexual activity: Yes  Lifestyle  . Physical activity:    Days per week: Not on file    Minutes per session: Not on file  . Stress: Not on file  Relationships  . Social connections:    Talks on phone: Not on file    Gets together: Not on file    Attends religious service: Not on file    Active member of club or organization: Not on file    Attends meetings of clubs or organizations: Not on file    Relationship status: Not on file  . Intimate partner violence:    Fear of current or ex partner: Not on file    Emotionally abused: Not on file    Physically abused: Not on file    Forced sexual activity: Not on file  Other Topics Concern  . Not on file  Social History Narrative   Lives at home with wife and 2 children.   Stressful job.     Exercise biking and lift weights   Review  of Systems 13 point ROS - negative     Objective:   Physical Exam  Constitutional: He is oriented to person, place, and time. He appears well-developed and well-nourished.  HENT:  Head: Normocephalic and atraumatic.  Right Ear: External ear normal.  Left Ear: External ear normal.  Mouth/Throat: Oropharynx is clear and moist.  Eyes: Pupils are equal, round, and reactive to light. Conjunctivae and EOM are normal.  Neck: Normal range of motion. Neck supple. No thyromegaly present.  Cardiovascular: Normal rate, regular rhythm, normal heart sounds and intact distal pulses.  Pulmonary/Chest: Effort normal and breath sounds normal. No respiratory distress. He has no wheezes.  Abdominal: Soft. He exhibits no distension. There is no tenderness.  Musculoskeletal: Normal range of motion. He exhibits no edema or tenderness.  Lymphadenopathy:    He has no cervical adenopathy.  Neurological: He is alert and oriented to person, place, and time. He has normal reflexes.  Skin: Skin is warm and dry.  Right foot: great toe with thickened nail distal 3/4, and 2nd toe thickened nail debrided  Psychiatric: He has a normal mood and affect. His behavior is normal.  Vitals reviewed.  Vitals:   05/07/17 1337  BP: 120/78  Pulse: (!) 56  Temp: 98.4 F (36.9 C)  TempSrc: Oral  SpO2: 96%  Weight: 221 lb 9.6 oz (100.5 kg)  Height: 5\' 10"  (1.778 m)       Assessment & Plan:    Matthew Henson is a 57 y.o. male Annual physical exam  - -anticipatory guidance as below in AVS, screening labs above. Health maintenance items as above in HPI discussed/recommended as applicable.   -Due for repeat colonoscopy, advised to call and schedule appointment, let me know if referral needed  Stress - Plan: FLUoxetine (PROZAC) 20 MG tablet Depression with anxiety - Plan: FLUoxetine (PROZAC) 20 MG tablet  -Improved at higher dose of Prozac, has Xanax as needed.  No changes for now.   -4 episodic insomnia, advised to try  Xanax first.  If that is not helpful, then follow-up to discuss other options.  Need for shingles vaccine - Plan: Zoster Vaccine Adjuvanted [facility]) injection sent to pharmacy  Hyperlipidemia, unspecified hyperlipidemia type - Plan: Lipid panel, Comprehensive metabolic panel  -Check labs, determine treatment based on results/ASCVD risk  Need for Tdap vaccination - Plan: Tdap vaccine greater than or equal to 7yo IM given  Onychomycosis - Plan: Efinaconazole (JUBLIA) 10 % SOLN  -Options discussed, he does not want to start Lamisil, would like to try Jublia if it is covered.  Prescription provided.  Follow-up to discuss other treatments if not covered  Meds ordered this encounter  Medications  . FLUoxetine (PROZAC) 20 MG tablet    Sig: Take 2 tablets (40 mg total) by mouth daily.    Dispense:  180 tablet    Refill:  1  . Zoster Vaccine Adjuvanted [facility]) injection    Sig: Inject 0.5 mLs into the muscle once for 1 dose. Repeat in 2-6 months.    Dispense:  0.5 mL    Refill:  1  . Efinaconazole (JUBLIA) 10 % SOLN    Sig: Apply top to affected area QD for 48 weeks    Dispense:  8 mL    Refill:  3   Patient Instructions    Try the xanax if needed for sleep.  If that is not helping, please follow up to discuss other options.   Continue 40mg  prozac per day.    Jublia was prescribed to see if that is covered. See other info below on onychomycosis.   You are due for a repeat colonoscopy. Call and schedule that test, but let me know if they need a referral.    Fungal Nail Infection Fungal nail infection  is a common fungal infection of the toenails or fingernails. This condition affects toenails more often than fingernails. More than one nail may be infected. The condition can be passed from person to person (is contagious). What are the causes? This condition is caused by a fungus. Several types of funguses can cause the infection. These funguses are common in moist and warm areas.  If your hands or feet come into contact with the fungus, it may get into a crack in your fingernail or toenail and cause the infection. What increases the risk? The following factors may make you more likely to develop this condition:  Being male.  Having diabetes.  Being of older age.  Living with someone who has the fungus.  Walking barefoot in areas where the fungus thrives, such as showers or locker rooms.  Having poor circulation.  Wearing shoes and socks that cause your feet to sweat.  Having athlete's foot.  Having a nail injury or history of a recent nail surgery.  Having psoriasis.  Having a weak body defense system (immune system).  What are the signs or symptoms? Symptoms of this condition include:  A pale spot on the nail.  Thickening of the nail.  A nail that becomes yellow or brown.  A brittle or ragged nail edge.  A crumbling nail.  A nail that has lifted away from the nail bed.  How is this diagnosed? This condition is diagnosed with a physical exam. Your health care provider may take a scraping or clipping from your nail to test for the fungus. How is this treated? Mild infections do not need treatment. If you have significant nail changes, treatment may include:  Oral antifungal medicines. You may need to take the medicine for several weeks or several months, and you may not see the results for a long time. These medicines can cause side effects. Ask your health care provider what problems to watch for.  Antifungal nail polish and nail cream. These may be used along with oral antifungal medicines.  Laser treatment of the nail.  Surgery to remove the nail. This may be needed for the most severe infections.  Treatment takes a long time, and the infection may come back. Follow these instructions at home: Medicines  Take or apply over-the-counter and prescription medicines only as told by your health care provider.  Ask your health care  provider about using over-the-counter mentholated ointment on your nails. Lifestyle   Do not share personal items, such as towels or nail clippers.  Trim your nails often.  Wash and dry your hands and feet every day.  Wear absorbent socks, and change your socks frequently.  Wear shoes that allow air to circulate, such as sandals or canvas tennis shoes. Throw out old shoes.  Wear rubber gloves if you are working with your hands in wet areas.  Do not walk barefoot in shower rooms or locker rooms.  Do not use a nail salon that does not use clean instruments.  Do not use artificial nails. General instructions  Keep all follow-up visits as told by your health care provider. This is important.  Use antifungal foot powder on your feet and in your shoes. Contact a health care provider if: Your infection is not getting better or it is getting worse after several months. This information is not intended to replace advice given to you by your health care provider. Make sure you discuss any questions you have with your health care provider. Document Released: 01/13/2000  Document Revised: 06/23/2015 Document Reviewed: 07/19/2014 Elsevier Interactive Patient Education  2018 [place] you healthy  Get these tests  Blood pressure- Have your blood pressure checked once a year by your healthcare provider.  Normal blood pressure is 120/80  Weight- Have your body mass index (BMI) calculated to screen for obesity.  BMI is a measure of body fat based on height and weight. You can also calculate your own BMI at ViewBanking.si.  Cholesterol- Have your cholesterol checked every year.  Diabetes- Have your blood sugar checked regularly if you have high blood pressure, high cholesterol, have a family history of diabetes or if you are overweight.  Screening for Colon Cancer- Colonoscopy starting at age 64.  Screening may begin sooner depending on your family history and other  health conditions. Follow up colonoscopy as directed by your Gastroenterologist.  Screening for Prostate Cancer- Both blood work (PSA) and a rectal exam help screen for Prostate Cancer.  Screening begins at age 57 with African-American men and at age 55 with Caucasian men.  Screening may begin sooner depending on your family history.  Take these medicines  Aspirin- One aspirin daily can help prevent Heart disease and Stroke.  Flu shot- Every fall.  Tetanus- Every 10 years.  Zostavax- Once after the age of 46 to prevent Shingles.  Pneumonia shot- Once after the age of 59; if you are younger than 4, ask your healthcare provider if you need a Pneumonia shot.  Take these steps  Don't smoke- If you do smoke, talk to your doctor about quitting.  For tips on how to quit, go to www.smokefree.gov or call 1-800-QUIT-NOW.  Be physically active- Exercise 5 days a week for at least 30 minutes.  If you are not already physically active start slow and gradually work up to 30 minutes of moderate physical activity.  Examples of moderate activity include walking briskly, mowing the yard, dancing, swimming, bicycling, etc.  Eat a healthy diet- Eat a variety of healthy food such as fruits, vegetables, low fat milk, low fat cheese, yogurt, lean meant, poultry, fish, beans, tofu, etc. For more information go to www.thenutritionsource.org  Drink alcohol in moderation- Limit alcohol intake to less than two drinks a day. Never drink and drive.  Dentist- Brush and floss twice daily; visit your dentist twice a year.  Depression- Your emotional health is as important as your physical health. If you're feeling down, or losing interest in things you would normally enjoy please talk to your healthcare provider.  Eye exam- Visit your eye doctor every year.  Safe sex- If you may be exposed to a sexually transmitted infection, use a condom.  Seat belts- Seat belts can save your life; always wear  one.  Smoke/Carbon Monoxide detectors- These detectors need to be installed on the appropriate level of your home.  Replace batteries at least once a year.  Skin cancer- When out in the sun, cover up and use sunscreen 15 SPF or higher.  Violence- If anyone is threatening you, please tell your healthcare provider.  Living Will/ Health care power of attorney- Speak with your healthcare provider and family.   IF you received an x-ray today, you will receive an invoice from [facility]. Please contact [facility] at [phone] with questions or concerns regarding your invoice.   IF you received labwork today, you will receive an invoice from [facility]. Please contact [facility] at [phone] with questions or concerns regarding your invoice.   Our billing staff will not  be able to assist you with questions regarding bills from these companies.  You will be contacted with the lab results as soon as they are available. The fastest way to get your results is to activate your My Chart account. Instructions are located on the last page of this paperwork. If you have not heard from [place] regarding the results in 2 weeks, please contact this office.       I personally performed the services described in this documentation, which was scribed in my presence. The recorded information has been reviewed and considered for accuracy and completeness, addended by me as needed, and agree with information above.  Signed,   Merri Ray, MD [facility] [place].  05/09/17 1:55 PM

## 2017-05-07 NOTE — Patient Instructions (Addendum)
Try the xanax if needed for sleep.  If that is not helping, please follow up to discuss other options.   Continue 40mg  prozac per day.    Jublia was prescribed to see if that is covered. See other info below on onychomycosis.   You are due for a repeat colonoscopy. Call and schedule that test, but let me know if they need a referral.    Fungal Nail Infection Fungal nail infection is a common fungal infection of the toenails or fingernails. This condition affects toenails more often than fingernails. More than one nail may be infected. The condition can be passed from person to person (is contagious). What are the causes? This condition is caused by a fungus. Several types of funguses can cause the infection. These funguses are common in moist and warm areas. If your hands or feet come into contact with the fungus, it may get into a crack in your fingernail or toenail and cause the infection. What increases the risk? The following factors may make you more likely to develop this condition:  Being male.  Having diabetes.  Being of older age.  Living with someone who has the fungus.  Walking barefoot in areas where the fungus thrives, such as showers or locker rooms.  Having poor circulation.  Wearing shoes and socks that cause your feet to sweat.  Having athlete's foot.  Having a nail injury or history of a recent nail surgery.  Having psoriasis.  Having a weak body defense system (immune system).  What are the signs or symptoms? Symptoms of this condition include:  A pale spot on the nail.  Thickening of the nail.  A nail that becomes yellow or brown.  A brittle or ragged nail edge.  A crumbling nail.  A nail that has lifted away from the nail bed.  How is this diagnosed? This condition is diagnosed with a physical exam. Your health care provider may take a scraping or clipping from your nail to test for the fungus. How is this treated? Mild infections do not  need treatment. If you have significant nail changes, treatment may include:  Oral antifungal medicines. You may need to take the medicine for several weeks or several months, and you may not see the results for a long time. These medicines can cause side effects. Ask your health care provider what problems to watch for.  Antifungal nail polish and nail cream. These may be used along with oral antifungal medicines.  Laser treatment of the nail.  Surgery to remove the nail. This may be needed for the most severe infections.  Treatment takes a long time, and the infection may come back. Follow these instructions at home: Medicines  Take or apply over-the-counter and prescription medicines only as told by your health care provider.  Ask your health care provider about using over-the-counter mentholated ointment on your nails. Lifestyle   Do not share personal items, such as towels or nail clippers.  Trim your nails often.  Wash and dry your hands and feet every day.  Wear absorbent socks, and change your socks frequently.  Wear shoes that allow air to circulate, such as sandals or canvas tennis shoes. Throw out old shoes.  Wear rubber gloves if you are working with your hands in wet areas.  Do not walk barefoot in shower rooms or locker rooms.  Do not use a nail salon that does not use clean instruments.  Do not use artificial nails. General instructions  Keep all  follow-up visits as told by your health care provider. This is important.  Use antifungal foot powder on your feet and in your shoes. Contact a health care provider if: Your infection is not getting better or it is getting worse after several months. This information is not intended to replace advice given to you by your health care provider. Make sure you discuss any questions you have with your health care provider. Document Released: 01/13/2000 Document Revised: 06/23/2015 Document Reviewed: 07/19/2014 Elsevier  Interactive Patient Education  2018 [place] you healthy  Get these tests  Blood pressure- Have your blood pressure checked once a year by your healthcare provider.  Normal blood pressure is 120/80  Weight- Have your body mass index (BMI) calculated to screen for obesity.  BMI is a measure of body fat based on height and weight. You can also calculate your own BMI at [email or web address].  Cholesterol- Have your cholesterol checked every year.  Diabetes- Have your blood sugar checked regularly if you have high blood pressure, high cholesterol, have a family history of diabetes or if you are overweight.  Screening for Colon Cancer- Colonoscopy starting at age 92.  Screening may begin sooner depending on your family history and other health conditions. Follow up colonoscopy as directed by your Gastroenterologist.  Screening for Prostate Cancer- Both blood work (PSA) and a rectal exam help screen for Prostate Cancer.  Screening begins at age 18 with African-American men and at age 46 with Caucasian men.  Screening may begin sooner depending on your family history.  Take these medicines  Aspirin- One aspirin daily can help prevent Heart disease and Stroke.  Flu shot- Every fall.  Tetanus- Every 10 years.  Zostavax- Once after the age of 51 to prevent Shingles.  Pneumonia shot- Once after the age of 20; if you are younger than 43, ask your healthcare provider if you need a Pneumonia shot.  Take these steps  Don't smoke- If you do smoke, talk to your doctor about quitting.  For tips on how to quit, go to www.smokefree.gov or call 1-800-QUIT-NOW.  Be physically active- Exercise 5 days a week for at least 30 minutes.  If you are not already physically active start slow and gradually work up to 30 minutes of moderate physical activity.  Examples of moderate activity include walking briskly, mowing the yard, dancing, swimming, bicycling, etc.  Eat a healthy diet- Eat a  variety of healthy food such as fruits, vegetables, low fat milk, low fat cheese, yogurt, lean meant, poultry, fish, beans, tofu, etc. For more information go to www.thenutritionsource.org  Drink alcohol in moderation- Limit alcohol intake to less than two drinks a day. Never drink and drive.  Dentist- Brush and floss twice daily; visit your dentist twice a year.  Depression- Your emotional health is as important as your physical health. If you're feeling down, or losing interest in things you would normally enjoy please talk to your healthcare provider.  Eye exam- Visit your eye doctor every year.  Safe sex- If you may be exposed to a sexually transmitted infection, use a condom.  Seat belts- Seat belts can save your life; always wear one.  Smoke/Carbon Monoxide detectors- These detectors need to be installed on the appropriate level of your home.  Replace batteries at least once a year.  Skin cancer- When out in the sun, cover up and use sunscreen 15 SPF or higher.  Violence- If anyone is threatening you, please tell your healthcare provider.  Living Will/ Health care power of attorney- Speak with your healthcare provider and family.   IF you received an x-ray today, you will receive an invoice from [facility]. Please contact [facility] at [phone] with questions or concerns regarding your invoice.   IF you received labwork today, you will receive an invoice from [place]. Please contact [facility] at [phone] with questions or concerns regarding your invoice.   Our billing staff will not be able to assist you with questions regarding bills from these companies.  You will be contacted with the lab results as soon as they are available. The fastest way to get your results is to activate your My Chart account. Instructions are located on the last page of this paperwork. If you have not heard from [place] regarding the results in 2 weeks, please contact this office.

## 2017-05-08 DIAGNOSIS — E785 Hyperlipidemia, unspecified: Secondary | ICD-10-CM | POA: Diagnosis not present

## 2017-05-08 DIAGNOSIS — Z23 Encounter for immunization: Secondary | ICD-10-CM | POA: Diagnosis not present

## 2017-05-08 LAB — LIPID PANEL
CHOL/HDL RATIO: 4.3 ratio (ref 0.0–5.0)
Cholesterol, Total: 211 mg/dL — ABNORMAL HIGH (ref 100–199)
HDL: 49 mg/dL (ref >39)
LDL Calculated: 141 mg/dL — ABNORMAL HIGH (ref 0–99)
Triglycerides: 107 mg/dL (ref 0–149)
VLDL CHOLESTEROL CAL: 21 mg/dL (ref 5–40)

## 2017-05-08 LAB — COMPREHENSIVE METABOLIC PANEL
ALBUMIN: 4.6 g/dL (ref 3.5–5.5)
ALT: 24 IU/L (ref 0–44)
AST: 27 IU/L (ref 0–40)
Albumin/Globulin Ratio: 1.8 (ref 1.2–2.2)
Alkaline Phosphatase: 62 IU/L (ref 39–117)
BUN / CREAT RATIO: 15 (ref 9–20)
BUN: 15 mg/dL (ref 6–24)
Bilirubin Total: 0.8 mg/dL (ref 0.0–1.2)
CO2: 21 mmol/L (ref 20–29)
CREATININE: 0.99 mg/dL (ref 0.76–1.27)
Calcium: 9.2 mg/dL (ref 8.7–10.2)
Chloride: 101 mmol/L (ref 96–106)
GFR calc Af Amer: 99 mL/min/{1.73_m2} (ref >59)
GFR calc non Af Amer: 86 mL/min/{1.73_m2} (ref >59)
GLUCOSE: 75 mg/dL (ref 65–99)
Globulin, Total: 2.5 g/dL (ref 1.5–4.5)
Potassium: 3.9 mmol/L (ref 3.5–5.2)
Sodium: 140 mmol/L (ref 134–144)
Total Protein: 7.1 g/dL (ref 6.0–8.5)

## 2017-05-10 MED ORDER — EFINACONAZOLE 10 % EX SOLN: CUTANEOUS | 3 refills | Status: DC

## 2017-05-10 NOTE — Addendum Note (Signed)
Addended by: Merri Ray R on: 05/10/2017 12:31 PM   Modules accepted: Orders

## 2017-05-14 ENCOUNTER — Telehealth: Payer: Self-pay

## 2017-05-14 NOTE — Telephone Encounter (Signed)
Called and LVM advising pt that screening form that he left is ready to be picked up at the [address].

## 2017-05-22 NOTE — Telephone Encounter (Signed)
Pt would like to see if this form could be mailed to him.

## 2017-06-13 ENCOUNTER — Encounter: Payer: Self-pay | Admitting: Family Medicine

## 2017-06-13 DIAGNOSIS — F439 Reaction to severe stress, unspecified: Secondary | ICD-10-CM

## 2017-06-15 MED ORDER — ALPRAZOLAM 0.5 MG PO TABS: ORAL_TABLET | ORAL | 1 refills | Status: DC

## 2017-12-23 ENCOUNTER — Encounter: Payer: Self-pay | Admitting: Family Medicine

## 2017-12-23 DIAGNOSIS — K5730 Diverticulosis of large intestine without perforation or abscess without bleeding: Secondary | ICD-10-CM | POA: Diagnosis not present

## 2017-12-23 DIAGNOSIS — Z1211 Encounter for screening for malignant neoplasm of colon: Secondary | ICD-10-CM | POA: Diagnosis not present

## 2017-12-27 ENCOUNTER — Other Ambulatory Visit: Payer: Self-pay | Admitting: *Deleted

## 2017-12-27 ENCOUNTER — Encounter: Payer: Self-pay | Admitting: Family Medicine

## 2017-12-27 ENCOUNTER — Other Ambulatory Visit: Payer: Self-pay | Admitting: Family Medicine

## 2017-12-27 DIAGNOSIS — F439 Reaction to severe stress, unspecified: Secondary | ICD-10-CM

## 2017-12-27 DIAGNOSIS — F418 Other specified anxiety disorders: Secondary | ICD-10-CM

## 2017-12-27 NOTE — Telephone Encounter (Signed)
See MyChart message from 12/27/17. Medication called into pharmacy.

## 2018-03-12 ENCOUNTER — Other Ambulatory Visit: Payer: Self-pay | Admitting: Family Medicine

## 2018-03-12 DIAGNOSIS — F418 Other specified anxiety disorders: Secondary | ICD-10-CM

## 2018-03-12 DIAGNOSIS — F439 Reaction to severe stress, unspecified: Secondary | ICD-10-CM

## 2018-03-12 NOTE — Telephone Encounter (Signed)
Requested medication (s) are due for refill today: Yes  Requested medication (s) are on the active medication list: Yes  Last refill:  06/15/17  Future visit scheduled: Yes  Notes to clinic:  See request    Requested Prescriptions  Pending Prescriptions Disp Refills   ALPRAZolam (XANAX) 0.5 MG tablet [Pharmacy Med Name: ALPRAZolam 0.5 MG TABLET] 30 tablet 0    Sig: TAKE ONE TABLET BY MOUTH EVERY 8 TO 12 HOURS AS NEEDED FOR STRESS     Not Delegated - Psychiatry:  Anxiolytics/Hypnotics Failed - 03/12/2018  1:21 PM      Failed - This refill cannot be delegated      Failed - Urine Drug Screen completed in last 360 days.      Failed - Valid encounter within last 6 months    Recent Outpatient Visits          10 months ago Annual physical exam   [facility], Ranell Patrick, MD   1 year ago Stress   [facility], Ranell Patrick, MD   1 year ago Screening for hyperlipidemia   [facility], Ines Bloomer, MD   1 year ago Acute upper respiratory infection   [facility], Ranell Patrick, MD   2 years ago Depression with anxiety   [facility], Ranell Patrick, MD      Future Appointments            In 2 months Wendie Agreste, MD [facility] [place], [facility]         Signed Prescriptions Disp Refills   FLUoxetine (PROZAC) 20 MG tablet 180 tablet 0    Sig: TAKE TWO TABLETS BY MOUTH DAILY     Psychiatry:  Antidepressants - SSRI Failed - 03/12/2018  1:21 PM      Failed - Valid encounter within last 6 months    Recent Outpatient Visits          10 months ago Annual physical exam   [facility], Ranell Patrick, MD   1 year ago Stress   [facility], Ranell Patrick, MD   1 year ago Screening for hyperlipidemia   [facility], Ines Bloomer, MD   1 year ago Acute upper respiratory infection   [facility], Ranell Patrick, MD   2 years ago Depression with  anxiety   [facility], Ranell Patrick, MD      Future Appointments            In 2 months Wendie Agreste, MD [facility] at [place], [place] - Completed PHQ-2 or PHQ-9 in the last 360 days.    Yes

## 2018-03-13 NOTE — Telephone Encounter (Signed)
Please advise 

## 2018-03-13 NOTE — Telephone Encounter (Signed)
Discussed last OV 04/2017. Last filled 07/01/17. Appt scheduled 05/12/18.  Refill placed.

## 2018-05-08 ENCOUNTER — Encounter: Payer: Self-pay | Admitting: Family Medicine

## 2018-05-09 ENCOUNTER — Telehealth: Payer: Self-pay | Admitting: Family Medicine

## 2018-05-12 ENCOUNTER — Encounter: Payer: Self-pay | Admitting: Family Medicine

## 2018-05-12 ENCOUNTER — Ambulatory Visit (INDEPENDENT_AMBULATORY_CARE_PROVIDER_SITE_OTHER): Payer: BLUE CROSS/BLUE SHIELD | Admitting: Family Medicine

## 2018-05-12 ENCOUNTER — Other Ambulatory Visit: Payer: Self-pay

## 2018-05-12 DIAGNOSIS — F418 Other specified anxiety disorders: Secondary | ICD-10-CM | POA: Diagnosis not present

## 2018-05-12 DIAGNOSIS — Z0001 Encounter for general adult medical examination with abnormal findings: Secondary | ICD-10-CM | POA: Diagnosis not present

## 2018-05-12 DIAGNOSIS — Z125 Encounter for screening for malignant neoplasm of prostate: Secondary | ICD-10-CM | POA: Diagnosis not present

## 2018-05-12 DIAGNOSIS — E785 Hyperlipidemia, unspecified: Secondary | ICD-10-CM

## 2018-05-12 DIAGNOSIS — Z0000 Encounter for general adult medical examination without abnormal findings: Secondary | ICD-10-CM

## 2018-05-12 DIAGNOSIS — Z1329 Encounter for screening for other suspected endocrine disorder: Secondary | ICD-10-CM | POA: Diagnosis not present

## 2018-05-12 NOTE — Patient Instructions (Addendum)
Let me know if you would like shingles vaccine sent in.  Send me a BP reading - can use for determining if statin recommended for cholesterol once those labs are back (we will schedule you for lab visit).  Option of lower dose prozac if things are going well.  Follow up in 6 months.  Good talking to you today.  Let me know if there are any questions.  Take care.  Keeping you healthy  Get these tests  Blood pressure- Have your blood pressure checked once a year by your healthcare provider.  Normal blood pressure is 120/80  Weight- Have your body mass index (BMI) calculated to screen for obesity.  BMI is a measure of body fat based on height and weight. You can also calculate your own BMI at [email or web address].  Cholesterol- Have your cholesterol checked every year.  Diabetes- Have your blood sugar checked regularly if you have high blood pressure, high cholesterol, have a family history of diabetes or if you are overweight.  Screening for Colon Cancer- Colonoscopy starting at age 32.  Screening may begin sooner depending on your family history and other health conditions. Follow up colonoscopy as directed by your Gastroenterologist.  Screening for Prostate Cancer- Both blood work (PSA) and a rectal exam help screen for Prostate Cancer.  Screening begins at age 11 with African-American men and at age 77 with Caucasian men.  Screening may begin sooner depending on your family history.  Take these medicines  Aspirin- One aspirin daily can help prevent Heart disease and Stroke.  Flu shot- Every fall.  Tetanus- Every 10 years.  Zostavax- Once after the age of 61 to prevent Shingles.  Pneumonia shot- Once after the age of 71; if you are younger than 77, ask your healthcare provider if you need a Pneumonia shot.  Take these steps  Don't smoke- If you do smoke, talk to your doctor about quitting.  For tips on how to quit, go to www.smokefree.gov or call 1-800-QUIT-NOW.  Be  physically active- Exercise 5 days a week for at least 30 minutes.  If you are not already physically active start slow and gradually work up to 30 minutes of moderate physical activity.  Examples of moderate activity include walking briskly, mowing the yard, dancing, swimming, bicycling, etc.  Eat a healthy diet- Eat a variety of healthy food such as fruits, vegetables, low fat milk, low fat cheese, yogurt, lean meant, poultry, fish, beans, tofu, etc. For more information go to www.thenutritionsource.org  Drink alcohol in moderation- Limit alcohol intake to less than two drinks a day. Never drink and drive.  Dentist- Brush and floss twice daily; visit your dentist twice a year.  Depression- Your emotional health is as important as your physical health. If you're feeling down, or losing interest in things you would normally enjoy please talk to your healthcare provider.  Eye exam- Visit your eye doctor every year.  Safe sex- If you may be exposed to a sexually transmitted infection, use a condom.  Seat belts- Seat belts can save your life; always wear one.  Smoke/Carbon Monoxide detectors- These detectors need to be installed on the appropriate level of your home.  Replace batteries at least once a year.  Skin cancer- When out in the sun, cover up and use sunscreen 15 SPF or higher.  Violence- If anyone is threatening you, please tell your healthcare provider.  Living Will/ Health care power of attorney- Speak with your healthcare provider and family.   If you  have lab work done today you will be contacted with your lab results within the next 2 weeks.  If you have not heard from [place] then please contact us. The fastest way to get your results is to register for My Chart.   IF you received an x-ray today, you will receive an invoice from [facility]. Please contact [facility] at [phone] with questions or concerns regarding your invoice.   IF you received labwork  today, you will receive an invoice from [place]. Please contact [facility] at [phone] with questions or concerns regarding your invoice.   Our billing staff will not be able to assist you with questions regarding bills from these companies.  You will be contacted with the lab results as soon as they are available. The fastest way to get your results is to activate your My Chart account. Instructions are located on the last page of this paperwork. If you have not heard from [place] regarding the results in 2 weeks, please contact this office.

## 2018-05-12 NOTE — Progress Notes (Signed)
Virtual Visit via Telephone Note  I connected with Matthew Henson on 05/12/18 at 11:08 AM by telephone and verified that I am speaking with the correct person using two identifiers.   I discussed the limitations, risks, security and privacy concerns of performing an evaluation and management service by telephone and the availability of in person appointments. I also discussed with the patient that there may be a patient responsible charge related to this service. The patient expressed understanding and agreed to proceed, consent obtained  Chief complaint: annual exam.   History of Present Illness:  Depression with anxiety.   Has been on 40 mg of Prozac each day since 2018. Taking 20mg  BID. No new side effects.   Improved at that dose.   Alprazolam as needed. Controlled substance database (PDMP) reviewed. No concerns appreciated.  Last alprazolam prescription for #30 filled on February 14.  Previously was July 01, 2017 for same quantity.  Discussed use of Xanax if needed for episodic insomnia.  Sleeping better. Stress with work, but doing ok.   Depression screen [facility] 2/9 05/07/2017 11/22/2016 05/23/2016 05/14/2016 02/09/2016  Decreased Interest 0 0 0 0 0  Down, Depressed, Hopeless 0 0 0 0 0  PHQ - 2 Score 0 0 0 0 0  Altered sleeping - - - - -  Tired, decreased energy - - - - -  Change in appetite - - - - -  Feeling bad or failure about yourself  - - - - -  Trouble concentrating - - - - -  Moving slowly or fidgety/restless - - - - -  Suicidal thoughts - - - - -  PHQ-9 Score - - - - -   Hyperlipidemia:  Lab Results  Component Value Date   CHOL 211 (H) 05/07/2017   HDL 49 05/07/2017   LDLCALC 141 (H) 05/07/2017   TRIG 107 05/07/2017   CHOLHDL 4.3 05/07/2017   Lab Results  Component Value Date   ALT 24 05/07/2017   AST 27 05/07/2017   ALKPHOS 62 05/07/2017   BILITOT 0.8 05/07/2017    followed by cardiology previously.  Off meds.  Did not restart last year due to 10-year ASCVD risk  not quite at level to recommend medication.  Did recommend repeat testing in 6 months. Plans to check BP reading at home - will send in reading to help calculate ASCVD risk.   Cancer screening: Colonoscopy: 12/23/2017, [facility] gastroenterology. Diverticulosis. 10 year follow up.  Prostate: History of elevating PSA, followed by Dr. Junious Silk at [facility].  CT in January 2018 reportedly benign with a 50 g prostate.  Annual visit with urology, but last visit in 2019. Would like PSA checked. No new difficulty with urination. No nocturia/hematuria.    Immunization History  Administered Date(s) Administered   Influenza,inj,Quad PF,6+ Mos 12/15/2015   Td 01/29/1993   Tdap 05/08/2017  did not get flu vaccine this year.  Has not had   Dental: every 6 months.   Optho yearly. No new issues.   Exercise: 5-6 days per week - over 188mins per week.         Patient Active Problem List   Diagnosis Date Noted   Screening for hyperlipidemia 05/23/2016   Diverticulosis of colon without hemorrhage 12/21/2014   Bradycardia 11/17/2013   Depression 10/09/2011   Asthma, exercise induced 10/09/2011   Past Medical History:  Diagnosis Date   Depression    GERD (gastroesophageal reflux disease)    Other dyspnea and respiratory abnormality  Presumptive diagnosis of exercised induced asthma   Past Surgical History:  Procedure Laterality Date   None     VASECTOMY     No Known Allergies Prior to Admission medications   Medication Sig Start Date End Date Taking? Authorizing Provider  ALPRAZolam Duanne Moron) 0.5 MG tablet TAKE ONE TABLET BY MOUTH EVERY 8 TO 12 HOURS AS NEEDED FOR STRESS 03/13/18   Wendie Agreste, MD  Efinaconazole (JUBLIA) 10 % SOLN Apply top to affected area QD for 48 weeks, 2 drops to great toes, 1 drop to other toes. 05/10/17   Wendie Agreste, MD  FLUoxetine (PROZAC) 20 MG tablet TAKE TWO TABLETS BY MOUTH DAILY 03/12/18   Wendie Agreste, MD   Social History     Socioeconomic History   Marital status: Married    Spouse name: Not on file   Number of children: 2   Years of education: Not on file   Highest education level: Not on file  Occupational History    Comment: [employer] strain: Not on file   Food insecurity:    Worry: Not on file    Inability: Not on file   Transportation needs:    Medical: Not on file    Non-medical: Not on file  Tobacco Use   Smoking status: Never Smoker   Smokeless tobacco: Never Used   Tobacco comment: Light smoker years ago.  Substance and Sexual Activity   Alcohol use: Yes    Alcohol/week: 6.0 standard drinks    Types: 6 Cans of beer per week    Comment: LIGHT BEER   Drug use: No   Sexual activity: Yes  Lifestyle   Physical activity:    Days per week: Not on file    Minutes per session: Not on file   Stress: Not on file  Relationships   Social connections:    Talks on phone: Not on file    Gets together: Not on file    Attends religious service: Not on file    Active member of club or organization: Not on file    Attends meetings of clubs or organizations: Not on file    Relationship status: Not on file   Intimate partner violence:    Fear of current or ex partner: Not on file    Emotionally abused: Not on file    Physically abused: Not on file    Forced sexual activity: Not on file  Other Topics Concern   Not on file  Social History Narrative   Lives at home with wife and 2 children.   Stressful job.     Exercise biking and lift weights     Observations/Objective: No distress on phone. Euthymic mood.   Assessment and Plan: Annual physical exam - Plan: Comprehensive metabolic panel, Lipid Panel, PSA  - -anticipatory guidance as below in AVS, screening labs above. Health maintenance items as above in HPI discussed/recommended as applicable.   Depression with anxiety - Plan: TSH Screening for thyroid disorder - Plan: TSH  -  depression/anxiety stable.  Continue fluoxetine with option of lower dose this summer.  Rare alprazolam use.  Screening for prostate cancer - Plan: PSA  - We discussed limitation of screening with just PSA, he chose to have screening done. PSA obtained, then can determine if follow-up needed with urology.    Hyperlipidemia, unspecified hyperlipidemia type - Plan: Comprehensive metabolic panel, Lipid Panel  -Check labs, home BP readings to determine ASCVD  risk.  Follow Up Instructions:  6 months for depression/med review.   Patient Instructions    Let me know if you would like shingles vaccine sent in.  Send me a BP reading - can use for determining if statin recommended for cholesterol once those labs are back (we will schedule you for lab visit).  Option of lower dose prozac if things are going well.  Follow up in 6 months.  Good talking to you today.  Let me know if there are any questions.  Take care.  Keeping you healthy  Get these tests  Blood pressure- Have your blood pressure checked once a year by your healthcare provider.  Normal blood pressure is 120/80  Weight- Have your body mass index (BMI) calculated to screen for obesity.  BMI is a measure of body fat based on height and weight. You can also calculate your own BMI at [email or web address].  Cholesterol- Have your cholesterol checked every year.  Diabetes- Have your blood sugar checked regularly if you have high blood pressure, high cholesterol, have a family history of diabetes or if you are overweight.  Screening for Colon Cancer- Colonoscopy starting at age 56.  Screening may begin sooner depending on your family history and other health conditions. Follow up colonoscopy as directed by your Gastroenterologist.  Screening for Prostate Cancer- Both blood work (PSA) and a rectal exam help screen for Prostate Cancer.  Screening begins at age 67 with African-American men and at age 72 with Caucasian men.  Screening  may begin sooner depending on your family history.  Take these medicines  Aspirin- One aspirin daily can help prevent Heart disease and Stroke.  Flu shot- Every fall.  Tetanus- Every 10 years.  Zostavax- Once after the age of 35 to prevent Shingles.  Pneumonia shot- Once after the age of 64; if you are younger than 37, ask your healthcare provider if you need a Pneumonia shot.  Take these steps  Don't smoke- If you do smoke, talk to your doctor about quitting.  For tips on how to quit, go to www.smokefree.gov or call 1-800-QUIT-NOW.  Be physically active- Exercise 5 days a week for at least 30 minutes.  If you are not already physically active start slow and gradually work up to 30 minutes of moderate physical activity.  Examples of moderate activity include walking briskly, mowing the yard, dancing, swimming, bicycling, etc.  Eat a healthy diet- Eat a variety of healthy food such as fruits, vegetables, low fat milk, low fat cheese, yogurt, lean meant, poultry, fish, beans, tofu, etc. For more information go to www.thenutritionsource.org  Drink alcohol in moderation- Limit alcohol intake to less than two drinks a day. Never drink and drive.  Dentist- Brush and floss twice daily; visit your dentist twice a year.  Depression- Your emotional health is as important as your physical health. If you're feeling down, or losing interest in things you would normally enjoy please talk to your healthcare provider.  Eye exam- Visit your eye doctor every year.  Safe sex- If you may be exposed to a sexually transmitted infection, use a condom.  Seat belts- Seat belts can save your life; always wear one.  Smoke/Carbon Monoxide detectors- These detectors need to be installed on the appropriate level of your home.  Replace batteries at least once a year.  Skin cancer- When out in the sun, cover up and use sunscreen 15 SPF or higher.  Violence- If anyone is threatening you, please tell your  healthcare provider.  Living Will/ Health care power of attorney- Speak with your healthcare provider and family.   If you have lab work done today you will be contacted with your lab results within the next 2 weeks.  If you have not heard from [place] then please contact us. The fastest way to get your results is to register for My Chart.   IF you received an x-ray today, you will receive an invoice from [facility]. Please contact [facility] at [phone] with questions or concerns regarding your invoice.   IF you received labwork today, you will receive an invoice from [facility]. Please contact [facility] at [phone] with questions or concerns regarding your invoice.   Our billing staff will not be able to assist you with questions regarding bills from these companies.  You will be contacted with the lab results as soon as they are available. The fastest way to get your results is to activate your My Chart account. Instructions are located on the last page of this paperwork. If you have not heard from [place] regarding the results in 2 weeks, please contact this office.          I discussed the assessment and treatment plan with the patient. The patient was provided an opportunity to ask questions and all were answered. The patient agreed with the plan and demonstrated an understanding of the instructions.   The patient was advised to call back or seek an in-person evaluation if the symptoms worsen or if the condition fails to improve as anticipated.  I provided 14 minutes of non-face-to-face time during this encounter.  Signed,   Merri Ray, MD [facility].  05/12/18

## 2018-05-13 ENCOUNTER — Other Ambulatory Visit: Payer: Self-pay

## 2018-05-13 ENCOUNTER — Ambulatory Visit (INDEPENDENT_AMBULATORY_CARE_PROVIDER_SITE_OTHER): Payer: BLUE CROSS/BLUE SHIELD | Admitting: Family Medicine

## 2018-05-13 DIAGNOSIS — R001 Bradycardia, unspecified: Secondary | ICD-10-CM

## 2018-05-13 DIAGNOSIS — Z0000 Encounter for general adult medical examination without abnormal findings: Secondary | ICD-10-CM | POA: Diagnosis not present

## 2018-05-13 DIAGNOSIS — F418 Other specified anxiety disorders: Secondary | ICD-10-CM | POA: Diagnosis not present

## 2018-05-13 DIAGNOSIS — Z125 Encounter for screening for malignant neoplasm of prostate: Secondary | ICD-10-CM | POA: Diagnosis not present

## 2018-05-13 DIAGNOSIS — Z13220 Encounter for screening for lipoid disorders: Secondary | ICD-10-CM

## 2018-05-13 DIAGNOSIS — E785 Hyperlipidemia, unspecified: Secondary | ICD-10-CM | POA: Diagnosis not present

## 2018-05-14 LAB — LIPID PANEL
Chol/HDL Ratio: 4.9 ratio (ref 0.0–5.0)
Cholesterol, Total: 232 mg/dL — ABNORMAL HIGH (ref 100–199)
HDL: 47 mg/dL (ref >39)
LDL Calculated: 163 mg/dL — ABNORMAL HIGH (ref 0–99)
Triglycerides: 110 mg/dL (ref 0–149)
VLDL Cholesterol Cal: 22 mg/dL (ref 5–40)

## 2018-05-14 LAB — COMPREHENSIVE METABOLIC PANEL
ALT: 25 IU/L (ref 0–44)
AST: 28 IU/L (ref 0–40)
Albumin/Globulin Ratio: 1.8 (ref 1.2–2.2)
Albumin: 4.6 g/dL (ref 3.8–4.9)
Alkaline Phosphatase: 67 IU/L (ref 39–117)
BUN/Creatinine Ratio: 11 (ref 9–20)
BUN: 13 mg/dL (ref 6–24)
Bilirubin Total: 0.5 mg/dL (ref 0.0–1.2)
CO2: 21 mmol/L (ref 20–29)
Calcium: 9.6 mg/dL (ref 8.7–10.2)
Chloride: 101 mmol/L (ref 96–106)
Creatinine, Ser: 1.21 mg/dL (ref 0.76–1.27)
GFR calc Af Amer: 77 mL/min/{1.73_m2} (ref >59)
GFR calc non Af Amer: 67 mL/min/{1.73_m2} (ref >59)
Globulin, Total: 2.6 g/dL (ref 1.5–4.5)
Glucose: 92 mg/dL (ref 65–99)
Potassium: 4.6 mmol/L (ref 3.5–5.2)
Sodium: 139 mmol/L (ref 134–144)
Total Protein: 7.2 g/dL (ref 6.0–8.5)

## 2018-05-14 LAB — TSH: TSH: 2.07 u[IU]/mL (ref 0.450–4.500)

## 2018-05-14 LAB — PSA: Prostate Specific Ag, Serum: 3.2 ng/mL (ref 0.0–4.0)

## 2018-06-18 ENCOUNTER — Other Ambulatory Visit: Payer: Self-pay | Admitting: Family Medicine

## 2018-06-18 DIAGNOSIS — F439 Reaction to severe stress, unspecified: Secondary | ICD-10-CM

## 2018-06-18 DIAGNOSIS — F418 Other specified anxiety disorders: Secondary | ICD-10-CM

## 2018-06-19 NOTE — Telephone Encounter (Signed)
Recent visit. Discussed at that time. meds refilled.

## 2018-07-07 ENCOUNTER — Ambulatory Visit: Payer: BLUE CROSS/BLUE SHIELD | Admitting: Family Medicine

## 2018-07-08 NOTE — Telephone Encounter (Signed)
LABS WERE DONE

## 2018-07-21 ENCOUNTER — Encounter: Payer: Self-pay | Admitting: Family Medicine

## 2018-08-05 ENCOUNTER — Encounter: Payer: Self-pay | Admitting: Family Medicine

## 2018-08-05 DIAGNOSIS — E785 Hyperlipidemia, unspecified: Secondary | ICD-10-CM

## 2018-08-06 MED ORDER — ATORVASTATIN CALCIUM 10 MG PO TABS: 10.0000 mg | ORAL_TABLET | Freq: Every day | ORAL | 0 refills | Status: DC

## 2018-08-08 ENCOUNTER — Telehealth: Payer: Self-pay | Admitting: Family Medicine

## 2018-08-08 NOTE — Telephone Encounter (Signed)
Pt had CPE in April 2020. He dropped off paperwork for the provider to fill out for his job.

## 2018-09-28 ENCOUNTER — Encounter: Payer: Self-pay | Admitting: Family Medicine

## 2018-10-02 ENCOUNTER — Other Ambulatory Visit: Payer: Self-pay

## 2018-10-02 NOTE — Progress Notes (Unsigned)
l °

## 2018-10-10 ENCOUNTER — Other Ambulatory Visit: Payer: Self-pay | Admitting: Emergency Medicine

## 2018-10-10 ENCOUNTER — Ambulatory Visit (INDEPENDENT_AMBULATORY_CARE_PROVIDER_SITE_OTHER): Payer: BC Managed Care – PPO | Admitting: Family Medicine

## 2018-10-10 ENCOUNTER — Other Ambulatory Visit: Payer: Self-pay

## 2018-10-10 DIAGNOSIS — E785 Hyperlipidemia, unspecified: Secondary | ICD-10-CM

## 2018-10-11 LAB — LIPID PANEL
Chol/HDL Ratio: 4 ratio (ref 0.0–5.0)
Cholesterol, Total: 174 mg/dL (ref 100–199)
HDL: 44 mg/dL (ref >39)
LDL Chol Calc (NIH): 104 mg/dL — ABNORMAL HIGH (ref 0–99)
Triglycerides: 145 mg/dL (ref 0–149)
VLDL Cholesterol Cal: 26 mg/dL (ref 5–40)

## 2018-10-11 LAB — COMPREHENSIVE METABOLIC PANEL
ALT: 24 IU/L (ref 0–44)
AST: 27 IU/L (ref 0–40)
Albumin/Globulin Ratio: 1.9 (ref 1.2–2.2)
Albumin: 4.7 g/dL (ref 3.8–4.9)
Alkaline Phosphatase: 61 IU/L (ref 39–117)
BUN/Creatinine Ratio: 14 (ref 9–20)
BUN: 16 mg/dL (ref 6–24)
Bilirubin Total: 0.9 mg/dL (ref 0.0–1.2)
CO2: 21 mmol/L (ref 20–29)
Calcium: 9.5 mg/dL (ref 8.7–10.2)
Chloride: 102 mmol/L (ref 96–106)
Creatinine, Ser: 1.13 mg/dL (ref 0.76–1.27)
GFR calc Af Amer: 84 mL/min/{1.73_m2} (ref >59)
GFR calc non Af Amer: 73 mL/min/{1.73_m2} (ref >59)
Globulin, Total: 2.5 g/dL (ref 1.5–4.5)
Glucose: 97 mg/dL (ref 65–99)
Potassium: 4.3 mmol/L (ref 3.5–5.2)
Sodium: 139 mmol/L (ref 134–144)
Total Protein: 7.2 g/dL (ref 6.0–8.5)

## 2018-10-14 ENCOUNTER — Encounter: Payer: Self-pay | Admitting: Family Medicine

## 2018-11-14 ENCOUNTER — Ambulatory Visit: Payer: BLUE CROSS/BLUE SHIELD | Admitting: Family Medicine

## 2018-12-03 ENCOUNTER — Other Ambulatory Visit: Payer: Self-pay | Admitting: Family Medicine

## 2018-12-03 DIAGNOSIS — E785 Hyperlipidemia, unspecified: Secondary | ICD-10-CM

## 2018-12-03 MED ORDER — ATORVASTATIN CALCIUM 10 MG PO TABS: 10.0000 mg | ORAL_TABLET | Freq: Every day | ORAL | 0 refills | Status: DC

## 2019-03-13 ENCOUNTER — Other Ambulatory Visit: Payer: Self-pay | Admitting: Family Medicine

## 2019-03-13 DIAGNOSIS — F439 Reaction to severe stress, unspecified: Secondary | ICD-10-CM

## 2019-03-13 MED ORDER — ALPRAZOLAM 0.5 MG PO TABS: 0.5000 mg | ORAL_TABLET | Freq: Two times a day (BID) | ORAL | 0 refills | Status: DC | PRN

## 2019-03-13 NOTE — Telephone Encounter (Signed)
Patient is requesting a refill of the following medications: Requested Prescriptions   Pending Prescriptions Disp Refills  . ALPRAZolam (XANAX) 0.5 MG tablet 30 tablet 0    Date of patient request:03/13/19 Last office visit: 10/10/2018 Date of last refill: 06/19/18 Last refill amount: 30 Tablets Follow up time period per chart: No appointment scheduled.

## 2019-03-13 NOTE — Telephone Encounter (Signed)
Controlled substance database (PDMP) reviewed. No concerns appreciated. Last refilled in May last year. Refilled, but should have OV prior to repeat refill.

## 2019-03-16 ENCOUNTER — Encounter: Payer: Self-pay | Admitting: Family Medicine

## 2019-03-16 ENCOUNTER — Other Ambulatory Visit: Payer: Self-pay | Admitting: Family Medicine

## 2019-03-16 DIAGNOSIS — E785 Hyperlipidemia, unspecified: Secondary | ICD-10-CM

## 2019-03-17 ENCOUNTER — Other Ambulatory Visit: Payer: Self-pay | Admitting: *Deleted

## 2019-03-17 DIAGNOSIS — F418 Other specified anxiety disorders: Secondary | ICD-10-CM

## 2019-03-17 DIAGNOSIS — F439 Reaction to severe stress, unspecified: Secondary | ICD-10-CM

## 2019-03-17 MED ORDER — FLUOXETINE HCL 20 MG PO TABS
40.0000 mg | ORAL_TABLET | Freq: Every day | ORAL | 0 refills | Status: DC
Start: 2019-03-17 — End: 2019-07-03

## 2019-03-17 NOTE — Telephone Encounter (Signed)
Pt. Called and scheduled for visit on 05/14/2019. Pt. Verbalized understanding that the Xanax prescription would run out before the appt. Date and asserted that this would be ok with himself.

## 2019-03-18 MED ORDER — ATORVASTATIN CALCIUM 10 MG PO TABS: 10.0000 mg | ORAL_TABLET | Freq: Every day | ORAL | 0 refills | Status: DC

## 2019-04-29 ENCOUNTER — Encounter: Payer: Self-pay | Admitting: Family Medicine

## 2019-05-14 ENCOUNTER — Encounter: Payer: BC Managed Care – PPO | Admitting: Family Medicine

## 2019-06-11 ENCOUNTER — Encounter: Payer: BC Managed Care – PPO | Admitting: Family Medicine

## 2019-06-25 ENCOUNTER — Encounter: Payer: Self-pay | Admitting: Family Medicine

## 2019-06-25 ENCOUNTER — Other Ambulatory Visit: Payer: Self-pay | Admitting: Emergency Medicine

## 2019-06-25 DIAGNOSIS — E785 Hyperlipidemia, unspecified: Secondary | ICD-10-CM

## 2019-06-25 DIAGNOSIS — Z13220 Encounter for screening for lipoid disorders: Secondary | ICD-10-CM

## 2019-06-25 DIAGNOSIS — Z125 Encounter for screening for malignant neoplasm of prostate: Secondary | ICD-10-CM

## 2019-06-25 DIAGNOSIS — Z0000 Encounter for general adult medical examination without abnormal findings: Secondary | ICD-10-CM

## 2019-06-25 DIAGNOSIS — Z1329 Encounter for screening for other suspected endocrine disorder: Secondary | ICD-10-CM

## 2019-06-26 ENCOUNTER — Other Ambulatory Visit: Payer: Self-pay

## 2019-06-26 ENCOUNTER — Ambulatory Visit (INDEPENDENT_AMBULATORY_CARE_PROVIDER_SITE_OTHER): Payer: BC Managed Care – PPO | Admitting: Family Medicine

## 2019-06-26 DIAGNOSIS — Z0000 Encounter for general adult medical examination without abnormal findings: Secondary | ICD-10-CM

## 2019-06-27 LAB — CMP14+EGFR
ALT: 22 IU/L (ref 0–44)
AST: 28 IU/L (ref 0–40)
Albumin/Globulin Ratio: 1.7 (ref 1.2–2.2)
Albumin: 4.5 g/dL (ref 3.8–4.9)
Alkaline Phosphatase: 72 IU/L (ref 48–121)
BUN/Creatinine Ratio: 16 (ref 9–20)
BUN: 21 mg/dL (ref 6–24)
Bilirubin Total: 0.8 mg/dL (ref 0.0–1.2)
CO2: 22 mmol/L (ref 20–29)
Calcium: 9.7 mg/dL (ref 8.7–10.2)
Chloride: 100 mmol/L (ref 96–106)
Creatinine, Ser: 1.32 mg/dL — ABNORMAL HIGH (ref 0.76–1.27)
GFR calc Af Amer: 69 mL/min/{1.73_m2} (ref >59)
GFR calc non Af Amer: 60 mL/min/{1.73_m2} (ref >59)
Globulin, Total: 2.7 g/dL (ref 1.5–4.5)
Glucose: 74 mg/dL (ref 65–99)
Potassium: 4.4 mmol/L (ref 3.5–5.2)
Sodium: 140 mmol/L (ref 134–144)
Total Protein: 7.2 g/dL (ref 6.0–8.5)

## 2019-06-27 LAB — LIPID PANEL
Chol/HDL Ratio: 4.1 ratio (ref 0.0–5.0)
Cholesterol, Total: 187 mg/dL (ref 100–199)
HDL: 46 mg/dL (ref >39)
LDL Chol Calc (NIH): 123 mg/dL — ABNORMAL HIGH (ref 0–99)
Triglycerides: 99 mg/dL (ref 0–149)
VLDL Cholesterol Cal: 18 mg/dL (ref 5–40)

## 2019-06-27 LAB — PSA: Prostate Specific Ag, Serum: 4.1 ng/mL — ABNORMAL HIGH (ref 0.0–4.0)

## 2019-06-27 LAB — TSH: TSH: 2.57 u[IU]/mL (ref 0.450–4.500)

## 2019-07-03 ENCOUNTER — Encounter: Payer: Self-pay | Admitting: Family Medicine

## 2019-07-03 ENCOUNTER — Other Ambulatory Visit: Payer: Self-pay

## 2019-07-03 ENCOUNTER — Ambulatory Visit (INDEPENDENT_AMBULATORY_CARE_PROVIDER_SITE_OTHER): Payer: BC Managed Care – PPO | Admitting: Family Medicine

## 2019-07-03 VITALS — BP 116/77 | HR 84 | Temp 97.8°F | Resp 15 | Ht 70.0 in | Wt 227.2 lb

## 2019-07-03 DIAGNOSIS — G4700 Insomnia, unspecified: Secondary | ICD-10-CM

## 2019-07-03 DIAGNOSIS — E785 Hyperlipidemia, unspecified: Secondary | ICD-10-CM

## 2019-07-03 DIAGNOSIS — F439 Reaction to severe stress, unspecified: Secondary | ICD-10-CM

## 2019-07-03 DIAGNOSIS — Z0000 Encounter for general adult medical examination without abnormal findings: Secondary | ICD-10-CM

## 2019-07-03 DIAGNOSIS — F418 Other specified anxiety disorders: Secondary | ICD-10-CM

## 2019-07-03 DIAGNOSIS — Z0001 Encounter for general adult medical examination with abnormal findings: Secondary | ICD-10-CM

## 2019-07-03 DIAGNOSIS — R972 Elevated prostate specific antigen [PSA]: Secondary | ICD-10-CM

## 2019-07-03 DIAGNOSIS — R7989 Other specified abnormal findings of blood chemistry: Secondary | ICD-10-CM

## 2019-07-03 DIAGNOSIS — N401 Enlarged prostate with lower urinary tract symptoms: Secondary | ICD-10-CM | POA: Diagnosis not present

## 2019-07-03 DIAGNOSIS — R3912 Poor urinary stream: Secondary | ICD-10-CM

## 2019-07-03 MED ORDER — ATORVASTATIN CALCIUM 10 MG PO TABS: 10.0000 mg | ORAL_TABLET | Freq: Every day | ORAL | 0 refills | Status: DC

## 2019-07-03 MED ORDER — ALPRAZOLAM 0.5 MG PO TABS: 0.5000 mg | ORAL_TABLET | Freq: Two times a day (BID) | ORAL | 0 refills | Status: DC | PRN

## 2019-07-03 MED ORDER — FLUOXETINE HCL 20 MG PO TABS: 20.0000 mg | ORAL_TABLET | Freq: Every day | ORAL | 3 refills | Status: DC

## 2019-07-03 NOTE — Patient Instructions (Addendum)
Ok to remain on same dose prozac.  See info on stress and sleep, but try melatonin as well. Xanax if needed for now.  Follow up if more difficulty with sleep.   Try statin 4-5 days per week, repeat test in 3-6 months.  Call Dr. Junious Silk for follow up of elevated PSA recently and urinary symptoms. Let me know if referral needed.  Antihistamines can make urinary symptoms worse - decrease those as able.      Keeping you healthy  Get these tests  Blood pressure- Have your blood pressure checked once a year by your healthcare provider.  Normal blood pressure is 120/80  Weight- Have your body mass index (BMI) calculated to screen for obesity.  BMI is a measure of body fat based on height and weight. You can also calculate your own BMI at [email or web address].  Cholesterol- Have your cholesterol checked every year.  Diabetes- Have your blood sugar checked regularly if you have high blood pressure, high cholesterol, have a family history of diabetes or if you are overweight.  Screening for Colon Cancer- Colonoscopy starting at age 57.  Screening may begin sooner depending on your family history and other health conditions. Follow up colonoscopy as directed by your Gastroenterologist.  Screening for Prostate Cancer- Both blood work (PSA) and a rectal exam help screen for Prostate Cancer.  Screening begins at age 57 with African-American men and at age 57 with Caucasian men.  Screening may begin sooner depending on your family history.  Take these medicines  Aspirin- One aspirin daily can help prevent Heart disease and Stroke.  Flu shot- Every fall.  Tetanus- Every 10 years.  Zostavax- Once after the age of 17 to prevent Shingles.  Pneumonia shot- Once after the age of 57; if you are younger than 36, ask your healthcare provider if you need a Pneumonia shot.  Take these steps  Don't smoke- If you do smoke, talk to your doctor about quitting.  For tips on how to quit, go to  www.smokefree.gov or call 1-800-QUIT-NOW.  Be physically active- Exercise 5 days a week for at least 30 minutes.  If you are not already physically active start slow and gradually work up to 30 minutes of moderate physical activity.  Examples of moderate activity include walking briskly, mowing the yard, dancing, swimming, bicycling, etc.  Eat a healthy diet- Eat a variety of healthy food such as fruits, vegetables, low fat milk, low fat cheese, yogurt, lean meant, poultry, fish, beans, tofu, etc. For more information go to www.thenutritionsource.org  Drink alcohol in moderation- Limit alcohol intake to less than two drinks a day. Never drink and drive.  Dentist- Brush and floss twice daily; visit your dentist twice a year.  Depression- Your emotional health is as important as your physical health. If you're feeling down, or losing interest in things you would normally enjoy please talk to your healthcare provider.  Eye exam- Visit your eye doctor every year.  Safe sex- If you may be exposed to a sexually transmitted infection, use a condom.  Seat belts- Seat belts can save your life; always wear one.  Smoke/Carbon Monoxide detectors- These detectors need to be installed on the appropriate level of your home.  Replace batteries at least once a year.  Skin cancer- When out in the sun, cover up and use sunscreen 15 SPF or higher.  Violence- If anyone is threatening you, please tell your healthcare provider.  Living Will/ Health care power of attorney- Speak with your healthcare  provider and family. Insomnia Insomnia is a sleep disorder that makes it difficult to fall asleep or stay asleep. Insomnia can cause fatigue, low energy, difficulty concentrating, mood swings, and poor performance at work or school. There are three different ways to classify insomnia:  Difficulty falling asleep.  Difficulty staying asleep.  Waking up too early in the morning. Any type of insomnia can be  long-term (chronic) or short-term (acute). Both are common. Short-term insomnia usually lasts for three months or less. Chronic insomnia occurs at least three times a week for longer than three months. What are the causes? Insomnia may be caused by another condition, situation, or substance, such as:  Anxiety.  Certain medicines.  Gastroesophageal reflux disease (GERD) or other gastrointestinal conditions.  Asthma or other breathing conditions.  Restless legs syndrome, sleep apnea, or other sleep disorders.  Chronic pain.  Menopause.  Stroke.  Abuse of alcohol, tobacco, or illegal drugs.  Mental health conditions, such as depression.  Caffeine.  Neurological disorders, such as Alzheimer's disease.  An overactive thyroid (hyperthyroidism). Sometimes, the cause of insomnia may not be known. What increases the risk? Risk factors for insomnia include:  Gender. Women are affected more often than men.  Age. Insomnia is more common as you get older.  Stress.  Lack of exercise.  Irregular work schedule or working night shifts.  Traveling between different time zones.  Certain medical and mental health conditions. What are the signs or symptoms? If you have insomnia, the main symptom is having trouble falling asleep or having trouble staying asleep. This may lead to other symptoms, such as:  Feeling fatigued or having low energy.  Feeling nervous about going to sleep.  Not feeling rested in the morning.  Having trouble concentrating.  Feeling irritable, anxious, or depressed. How is this diagnosed? This condition may be diagnosed based on:  Your symptoms and medical history. Your health care provider may ask about: ? Your sleep habits. ? Any medical conditions you have. ? Your mental health.  A physical exam. How is this treated? Treatment for insomnia depends on the cause. Treatment may focus on treating an underlying condition that is causing insomnia.  Treatment may also include:  Medicines to help you sleep.  Counseling or therapy.  Lifestyle adjustments to help you sleep better. Follow these instructions at home: Eating and drinking   Limit or avoid alcohol, caffeinated beverages, and cigarettes, especially close to bedtime. These can disrupt your sleep.  Do not eat a large meal or eat spicy foods right before bedtime. This can lead to digestive discomfort that can make it hard for you to sleep. Sleep habits   Keep a sleep diary to help you and your health care provider figure out what could be causing your insomnia. Write down: ? When you sleep. ? When you wake up during the night. ? How well you sleep. ? How rested you feel the next day. ? Any side effects of medicines you are taking. ? What you eat and drink.  Make your bedroom a dark, comfortable place where it is easy to fall asleep. ? Put up shades or blackout curtains to block light from outside. ? Use a white noise machine to block noise. ? Keep the temperature cool.  Limit screen use before bedtime. This includes: ? Watching TV. ? Using your smartphone, tablet, or computer.  Stick to a routine that includes going to bed and waking up at the same times every day and night. This can help you  fall asleep faster. Consider making a quiet activity, such as reading, part of your nighttime routine.  Try to avoid taking naps during the day so that you sleep better at night.  Get out of bed if you are still awake after 15 minutes of trying to sleep. Keep the lights down, but try reading or doing a quiet activity. When you feel sleepy, go back to bed. General instructions  Take over-the-counter and prescription medicines only as told by your health care provider.  Exercise regularly, as told by your health care provider. Avoid exercise starting several hours before bedtime.  Use relaxation techniques to manage stress. Ask your health care provider to suggest some  techniques that may work well for you. These may include: ? Breathing exercises. ? Routines to release muscle tension. ? Visualizing peaceful scenes.  Make sure that you drive carefully. Avoid driving if you feel very sleepy.  Keep all follow-up visits as told by your health care provider. This is important. Contact a health care provider if:  You are tired throughout the day.  You have trouble in your daily routine due to sleepiness.  You continue to have sleep problems, or your sleep problems get worse. Get help right away if:  You have serious thoughts about hurting yourself or someone else. If you ever feel like you may hurt yourself or others, or have thoughts about taking your own life, get help right away. You can go to your nearest emergency department or call:  Your local emergency services (911 in the [place]  A suicide crisis helpline, such as the [facility] at 551-647-4901. This is open 24 hours a day. Summary  Insomnia is a sleep disorder that makes it difficult to fall asleep or stay asleep.  Insomnia can be long-term (chronic) or short-term (acute).  Treatment for insomnia depends on the cause. Treatment may focus on treating an underlying condition that is causing insomnia.  Keep a sleep diary to help you and your health care provider figure out what could be causing your insomnia. This information is not intended to replace advice given to you by your health care provider. Make sure you discuss any questions you have with your health care provider. Document Revised: 12/28/2016 Document Reviewed: 10/25/2016 Elsevier Patient Education  2020 [place], Adult Stress is a normal reaction to life events. Stress is what you feel when life demands more than you are used to, or more than you think you can handle. Some stress can be useful, such as studying for a test or meeting a deadline at work. Stress that occurs too often or  for too long can cause problems. It can affect your emotional health and interfere with relationships and normal daily activities. Too much stress can weaken your body's defense system (immune system) and increase your risk for physical illness. If you already have a medical problem, stress can make it worse. What are the causes? All sorts of life events can cause stress. An event that causes stress for one person may not be stressful for another person. Major life events, whether positive or negative, commonly cause stress. Examples include:  Losing a job or starting a new job.  Losing a loved one.  Moving to a new town or home.  Getting married or divorced.  Having a baby.  Getting injured or sick. Less obvious life events can also cause stress, especially if they occur day after day or in combination with each other.  Examples include:  Working long hours.  Driving in traffic.  Caring for children.  Being in debt.  Being in a difficult relationship. What are the signs or symptoms? Stress can cause emotional symptoms, including:  Anxiety. This is feeling worried, afraid, on edge, overwhelmed, or out of control.  Anger, including irritation or impatience.  Depression. This is feeling sad, down, helpless, or guilty.  Trouble focusing, remembering, or making decisions. Stress can cause physical symptoms, including:  Aches and pains. These may affect your head, neck, back, stomach, or other areas of your body.  Tight muscles or a clenched jaw.  Low energy.  Trouble sleeping. Stress can cause unhealthy behaviors, including:  Eating to feel better (overeating) or skipping meals.  Working too much or putting off tasks.  Smoking, drinking alcohol, or using drugs to feel better. How is this diagnosed? Stress is diagnosed through an assessment by your health care provider. He or she may diagnose this condition based on:  Your symptoms and any stressful life  events.  Your medical history.  Tests to rule out other causes of your symptoms. Depending on your condition, your health care provider may refer you to a specialist for further evaluation. How is this treated?  Stress management techniques are the recommended treatment for stress. Medicine is not typically recommended for the treatment of stress. Techniques to reduce your reaction to stressful life events include:  Stress identification. Monitor yourself for symptoms of stress and identify what causes stress for you. These skills may help you to avoid or prepare for stressful events.  Time management. Set your priorities, keep a calendar of events, and learn to say no. Taking these actions can help you avoid making too many commitments. Techniques for coping with stress include:  Rethinking the problem. Try to think realistically about stressful events rather than ignoring them or overreacting. Try to find the positives in a stressful situation rather than focusing on the negatives.  Exercise. Physical exercise can release both physical and emotional tension. The key is to find a form of exercise that you enjoy and do it regularly.  Relaxation techniques. These relax the body and mind. The key is to find one or more that you enjoy and use the techniques regularly. Examples include: ? Meditation, deep breathing, or progressive relaxation techniques. ? Yoga or tai chi. ? Biofeedback, mindfulness techniques, or journaling. ? Listening to music, being out in nature, or participating in other hobbies.  Practicing a healthy lifestyle. Eat a balanced diet, drink plenty of water, limit or avoid caffeine, and get plenty of sleep.  Having a strong support network. Spend time with family, friends, or other people you enjoy being around. Express your feelings and talk things over with someone you trust. Counseling or talk therapy with a mental health professional may be helpful if you are having  trouble managing stress on your own. Follow these instructions at home: Lifestyle   Avoid drugs.  Do not use any products that contain nicotine or tobacco, such as cigarettes, e-cigarettes, and chewing tobacco. If you need help quitting, ask your health care provider.  Limit alcohol intake to no more than 1 drink a day for nonpregnant women and 2 drinks a day for men. One drink equals 12 oz of beer, 5 oz of wine, or 1 oz of hard liquor  Do not use alcohol or drugs to relax.  Eat a balanced diet that includes fresh fruits and vegetables, whole grains, lean meats, fish, eggs, and beans, and  low-fat dairy. Avoid processed foods and foods high in added fat, sugar, and salt.  Exercise at least 30 minutes on 5 or more days each week.  Get 7-8 hours of sleep each night. General instructions   Practice stress management techniques as discussed with your health care provider.  Drink enough fluid to keep your urine clear or pale yellow.  Take over-the-counter and prescription medicines only as told by your health care provider.  Keep all follow-up visits as told by your health care provider. This is important. Contact a health care provider if:  Your symptoms get worse.  You have new symptoms.  You feel overwhelmed by your problems and can no longer manage them on your own. Get help right away if:  You have thoughts of hurting yourself or others. If you ever feel like you may hurt yourself or others, or have thoughts about taking your own life, get help right away. You can go to your nearest emergency department or call:  Your local emergency services (911 in the [place]  A suicide crisis helpline, such as the [facility] at 5731537771. This is open 24 hours a day. Summary  Stress is a normal reaction to life events. It can cause problems if it happens too often or for too long.  Practicing stress management techniques is the best way to treat  stress.  Counseling or talk therapy with a mental health professional may be helpful if you are having trouble managing stress on your own. This information is not intended to replace advice given to you by your health care provider. Make sure you discuss any questions you have with your health care provider. Document Revised: 08/15/2018 Document Reviewed: 03/07/2016 Elsevier Patient Education  [employer].       If you have lab work done today you will be contacted with your lab results within the next 2 weeks.  If you have not heard from [place] then please contact us. The fastest way to get your results is to register for My Chart.   IF you received an x-ray today, you will receive an invoice from [facility]. Please contact [facility] at [phone] with questions or concerns regarding your invoice.   IF you received labwork today, you will receive an invoice from [facility]. Please contact [facility] at [phone] with questions or concerns regarding your invoice.   Our billing staff will not be able to assist you with questions regarding bills from these companies.  You will be contacted with the lab results as soon as they are available. The fastest way to get your results is to activate your My Chart account. Instructions are located on the last page of this paperwork. If you have not heard from [place] regarding the results in 2 weeks, please contact this office.

## 2019-07-03 NOTE — Progress Notes (Signed)
Subjective:  Patient ID: Matthew Henson, male    DOB: 1960/03/27  Age: 59 y.o. MRN: [id number]  CC:  Chief Complaint  Patient presents with  . Annual Exam    pt is here today for annual physicals states no concerns just his normal chronic conditions but well controlled at this time. pt does have form to be filled out for work.     HPI Matthew Henson presents for  Annual physical exam.   Work is busy. Stress with work.   Depression: Fluoxetine 20 mg twice daily prior. Now on once per day past few months. Overall stable at lower dose. Situational stress. Feels about the same.  Has alprazolam if needed for breakthrough symptoms of anxiety or insomnia.  Taking xanax per week for sleep.  Sleep not great. some difficulty with sleep onset and staying asleep. No PND, no nocturia. Late meetings at times.   Depression screen [facility] 2/9 07/03/2019 05/12/2018 05/07/2017 11/22/2016 05/23/2016  Decreased Interest 1 0 0 0 0  Down, Depressed, Hopeless 1 0 0 0 0  PHQ - 2 Score 2 0 0 0 0  Altered sleeping 1 - - - -  Tired, decreased energy 1 - - - -  Change in appetite 1 - - - -  Feeling bad or failure about yourself  1 - - - -  Trouble concentrating 0 - - - -  Moving slowly or fidgety/restless 0 - - - -  Suicidal thoughts 1 - - - -  PHQ-9 Score 7 - - - -  Difficult doing work/chores Not difficult at all - - - -   Hyperlipidemia: Lipitor 10 mg daily.  rx every 2-3 days. Myalgias with daily use, ok with every other day.  Exercising more recently.  Lab Results  Component Value Date   CHOL 187 06/26/2019   HDL 46 06/26/2019   LDLCALC 123 (H) 06/26/2019   TRIG 99 06/26/2019   CHOLHDL 4.1 06/26/2019   Lab Results  Component Value Date   ALT 22 06/26/2019   AST 28 06/26/2019   ALKPHOS 72 06/26/2019   BILITOT 0.8 06/26/2019   Cancer screening Colonoscopy 12/23/2017.  Prostate: Evaluated by urology previously, Dr. Junious Silk, February 2019 for enlarged prostate, BPH with LUTS.  CT January 2018 benign with  50 g prostate.  No family history of prostate cancer. Taking nasal spray for allergies and benadryl for allergies.  Trouble with decreased flow past few months. Hesitancy - mild. No nocturia. Does have some worse symptoms after bike ride.   Lab Results  Component Value Date   PSA1 4.1 (H) 06/26/2019   PSA1 3.2 05/13/2018   PSA 2.73 12/21/2014   PSA 2.17 11/17/2013   PSA 1.81 11/04/2012   Immunization History  Administered Date(s) Administered  . Influenza,inj,Quad PF,6+ Mos 12/15/2015  . Moderna SARS-COVID-2 Vaccination 04/30/2019, 05/28/2019  . Td 01/29/1993  . Tdap 05/08/2017    Hearing Screening   _0  _1  _2  _3  _4  _5  _6  _7  _8   Right ear:           Left ear:             Visual Acuity Screening   Right eye Left eye Both eyes  Without correction: _9  With correction:     optho- due for visit - will schedule.   Dental: every 6 months.   Exercise: daily now. 180 mins or greater.  Road cycling.   Elevated creatinine  -Borderline elevated on recent labs.  Previous range  0.94-1.13 from 2016-2020 Lab Results  Component Value Date   CREATININE 1.32 (H) 06/26/2019     History Patient Active Problem List   Diagnosis Date Noted  . Screening for hyperlipidemia 05/23/2016  . Diverticulosis of colon without hemorrhage 12/21/2014  . Bradycardia 11/17/2013  . Depression 10/09/2011  . 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 Depression   . GERD (gastroesophageal reflux disease)   . Other dyspnea and respiratory abnormality    Presumptive diagnosis of exercised induced asthma   Past Surgical History:  Procedure Laterality Date  . None    . VASECTOMY     No Known Allergies Prior to Admission medications   Medication Sig Start Date End Date Taking? Authorizing Provider  ALPRAZolam Duanne Moron) 0.5 MG tablet Take 1 tablet (0.5 mg total) by mouth 2 (two) times daily as needed for anxiety (or  stress.). 03/13/19  Yes Wendie Agreste, MD  atorvastatin (LIPITOR) 10 MG tablet Take 1 tablet (10 mg total) by mouth daily. 03/18/19  Yes Wendie Agreste, MD  FLUoxetine (PROZAC) 20 MG tablet Take 2 tablets (40 mg total) by mouth daily. 03/17/19  Yes Wendie Agreste, MD   Social History   Socioeconomic History  . Marital status: Married    Spouse name: Not on file  . Number of children: 2  . Years of education: Not on file  . Highest education level: Not on file  Occupational History    Comment: [employer]  Tobacco Use  . Smoking status: Never Smoker  . Smokeless tobacco: Never Used  . Tobacco comment: Light smoker years ago.  Substance and Sexual Activity  . Alcohol use: Yes    Alcohol/week: 6.0 standard drinks    Types: 6 Cans of beer per week    Comment: LIGHT BEER  . Drug use: No  . Sexual activity: Yes  Other Topics Concern  . Not on file  Social History Narrative   Lives at home with wife and 2 children.   Stressful job.     Exercise biking and lift weights   Social Determinants of Health   Financial Resource Strain:   . Difficulty of Paying Living Expenses:   Food Insecurity:   . Worried About Charity fundraiser in the Last Year:   . Arboriculturist in the Last Year:   Transportation Needs:   . Film/video editor (Medical):   Marland Kitchen Lack of Transportation (Non-Medical):   Physical Activity:   . Days of Exercise per Week:   . Minutes of Exercise per Session:   Stress:   . Feeling of Stress :   Social Connections:   . Frequency of Communication with Friends and Family:   . Frequency of Social Gatherings with Friends and Family:   . Attends Religious Services:   . Active Member of Clubs or Organizations:   . Attends Archivist Meetings:   Marland Kitchen Marital Status:   Intimate Partner Violence:   . Fear of Current or Ex-Partner:   . Emotionally Abused:   Marland Kitchen Physically Abused:   . Sexually Abused:     Review of Systems   Objective:   Vitals:    07/03/19 1522  BP: 116/77  Pulse: 84  Resp: 15  Temp: 97.8 F (36.6 C)  TempSrc: Temporal  SpO2: 97%  Weight: 227 lb 3.2 oz (103.1 kg)  Height: _0  (1.778 m)     Physical Exam Vitals reviewed.  Constitutional:  Appearance: He is well-developed.  HENT:     Head: Normocephalic and atraumatic.     Right Ear: External ear normal.     Left Ear: External ear normal.  Eyes:     Conjunctiva/sclera: Conjunctivae normal.     Pupils: Pupils are equal, round, and reactive to light.  Neck:     Thyroid: No thyromegaly.  Cardiovascular:     Rate and Rhythm: Normal rate and regular rhythm.     Heart sounds: Normal heart sounds.  Pulmonary:     Effort: Pulmonary effort is normal. No respiratory distress.     Breath sounds: Normal breath sounds. No wheezing.  Abdominal:     General: There is no distension.     Palpations: Abdomen is soft.     Tenderness: There is no abdominal tenderness.  Musculoskeletal:        General: No tenderness. Normal range of motion.     Cervical back: Normal range of motion and neck supple.  Lymphadenopathy:     Cervical: No cervical adenopathy.  Skin:    General: Skin is warm and dry.  Neurological:     Mental Status: He is alert and oriented to person, place, and time.     Deep Tendon Reflexes: Reflexes are normal and symmetric.  Psychiatric:        Behavior: Behavior normal.        Assessment & Plan:  Matthew Henson is a 59 y.o. male . Annual physical exam  - -anticipatory guidance as below in AVS, screening labs above. Health maintenance items as above in HPI discussed/recommended as applicable.   Stress - Plan: FLUoxetine (PROZAC) 20 MG tablet, ALPRAZolam (XANAX) 0.5 MG tablet Depression with anxiety - Plan: FLUoxetine (PROZAC) 20 MG tablet Insomnia, unspecified type  -Overall feels like symptoms are stable with fluoxetine at 20 mg daily. Continue same. Handout given on stress and stress management. Also handout given on insomnia with RTC  precautions if persistent. Over-the-counter melatonin for now is reasonable. Continue Xanax for now as needed. Recheck 3 months  Elevated PSA Benign prostatic hyperplasia with weak urinary stream  -Slight increase in PSA with some urinary symptoms. Some could be related to antihistamine use, some could be related to pudendal nerve irritation with biking, but with elevated PSA did recommend urology eval. He will call to schedule.  Hyperlipidemia, unspecified hyperlipidemia type - Plan: atorvastatin (LIPITOR) 10 MG tablet  -Tolerating Lipitor, but recommended trying to increase that dose to a few more days per week with repeat testing next few months  Elevated serum creatinine  -Borderline/mild elevation recently, urology eval as above for prostate but denies retention symptoms. Repeat testing in the next 3 months.  Meds ordered this encounter  Medications  . FLUoxetine (PROZAC) 20 MG tablet    Sig: Take 1 tablet (20 mg total) by mouth daily.    Dispense:  90 tablet    Refill:  3  . atorvastatin (LIPITOR) 10 MG tablet    Sig: Take 1 tablet (10 mg total) by mouth daily.    Dispense:  90 tablet    Refill:  0  . ALPRAZolam (XANAX) 0.5 MG tablet    Sig: Take 1 tablet (0.5 mg total) by mouth 2 (two) times daily as needed for anxiety (or stress.).    Dispense:  30 tablet    Refill:  0   Patient Instructions    Ok to remain on same dose prozac.  See info on stress and sleep, but try melatonin as  well. Xanax if needed for now.  Follow up if more difficulty with sleep.   Try statin 4-5 days per week, repeat test in 3-6 months.  Call Dr. Junious Silk for follow up of elevated PSA recently and urinary symptoms. Let me know if referral needed.  Antihistamines can make urinary symptoms worse - decrease those as able.      Keeping you healthy  Get these tests  Blood pressure- Have your blood pressure checked once a year by your healthcare provider.  Normal blood pressure is  120/80  Weight- Have your body mass index (BMI) calculated to screen for obesity.  BMI is a measure of body fat based on height and weight. You can also calculate your own BMI at View[employer].si.  Cholesterol- Have your cholesterol checked every year.  Diabetes- Have your blood sugar checked regularly if you have high blood pressure, high cholesterol, have a family history of diabetes or if you are overweight.  Screening for Colon Cancer- Colonoscopy starting at age 81.  Screening may begin sooner depending on your family history and other health conditions. Follow up colonoscopy as directed by your Gastroenterologist.  Screening for Prostate Cancer- Both blood work (PSA) and a rectal exam help screen for Prostate Cancer.  Screening begins at age 7 with African-American men and at age 27 with Caucasian men.  Screening may begin sooner depending on your family history.  Take these medicines  Aspirin- One aspirin daily can help prevent Heart disease and Stroke.  Flu shot- Every fall.  Tetanus- Every 10 years.  Zostavax- Once after the age of 66 to prevent Shingles.  Pneumonia shot- Once after the age of 97; if you are younger than 57, ask your healthcare provider if you need a Pneumonia shot.  Take these steps  Don't smoke- If you do smoke, talk to your doctor about quitting.  For tips on how to quit, go to www.smokefree.gov or call 1-800-QUIT-NOW.  Be physically active- Exercise 5 days a week for at least 30 minutes.  If you are not already physically active start slow and gradually work up to 30 minutes of moderate physical activity.  Examples of moderate activity include walking briskly, mowing the yard, dancing, swimming, bicycling, etc.  Eat a healthy diet- Eat a variety of healthy food such as fruits, vegetables, low fat milk, low fat cheese, yogurt, lean meant, poultry, fish, beans, tofu, etc. For more information go to www.thenutritionsource.org  Drink alcohol in  moderation- Limit alcohol intake to less than two drinks a day. Never drink and drive.  Dentist- Brush and floss twice daily; visit your dentist twice a year.  Depression- Your emotional health is as important as your physical health. If you're feeling down, or losing interest in things you would normally enjoy please talk to your healthcare provider.  Eye exam- Visit your eye doctor every year.  Safe sex- If you may be exposed to a sexually transmitted infection, use a condom.  Seat belts- Seat belts can save your life; always wear one.  Smoke/Carbon Monoxide detectors- These detectors need to be installed on the appropriate level of your home.  Replace batteries at least once a year.  Skin cancer- When out in the sun, cover up and use sunscreen 15 SPF or higher.  Violence- If anyone is threatening you, please tell your healthcare provider.  Living Will/ Health care power of attorney- Speak with your healthcare provider and family. Insomnia Insomnia is a sleep disorder that makes it difficult to fall asleep or  stay asleep. Insomnia can cause fatigue, low energy, difficulty concentrating, mood swings, and poor performance at work or school. There are three different ways to classify insomnia:  Difficulty falling asleep.  Difficulty staying asleep.  Waking up too early in the morning. Any type of insomnia can be long-term (chronic) or short-term (acute). Both are common. Short-term insomnia usually lasts for three months or less. Chronic insomnia occurs at least three times a week for longer than three months. What are the causes? Insomnia may be caused by another condition, situation, or substance, such as:  Anxiety.  Certain medicines.  Gastroesophageal reflux disease (GERD) or other gastrointestinal conditions.  Asthma or other breathing conditions.  Restless legs syndrome, sleep apnea, or other sleep disorders.  Chronic pain.  Menopause.  Stroke.  Abuse of alcohol,  tobacco, or illegal drugs.  Mental health conditions, such as depression.  Caffeine.  Neurological disorders, such as Alzheimer's disease.  An overactive thyroid (hyperthyroidism). Sometimes, the cause of insomnia may not be known. What increases the risk? Risk factors for insomnia include:  Gender. Women are affected more often than men.  Age. Insomnia is more common as you get older.  Stress.  Lack of exercise.  Irregular work schedule or working night shifts.  Traveling between different time zones.  Certain medical and mental health conditions. What are the signs or symptoms? If you have insomnia, the main symptom is having trouble falling asleep or having trouble staying asleep. This may lead to other symptoms, such as:  Feeling fatigued or having low energy.  Feeling nervous about going to sleep.  Not feeling rested in the morning.  Having trouble concentrating.  Feeling irritable, anxious, or depressed. How is this diagnosed? This condition may be diagnosed based on:  Your symptoms and medical history. Your health care provider may ask about: ? Your sleep habits. ? Any medical conditions you have. ? Your mental health.  A physical exam. How is this treated? Treatment for insomnia depends on the cause. Treatment may focus on treating an underlying condition that is causing insomnia. Treatment may also include:  Medicines to help you sleep.  Counseling or therapy.  Lifestyle adjustments to help you sleep better. Follow these instructions at home: Eating and drinking   Limit or avoid alcohol, caffeinated beverages, and cigarettes, especially close to bedtime. These can disrupt your sleep.  Do not eat a large meal or eat spicy foods right before bedtime. This can lead to digestive discomfort that can make it hard for you to sleep. Sleep habits   Keep a sleep diary to help you and your health care provider figure out what could be causing your  insomnia. Write down: ? When you sleep. ? When you wake up during the night. ? How well you sleep. ? How rested you feel the next day. ? Any side effects of medicines you are taking. ? What you eat and drink.  Make your bedroom a dark, comfortable place where it is easy to fall asleep. ? Put up shades or blackout curtains to block light from outside. ? Use a white noise machine to block noise. ? Keep the temperature cool.  Limit screen use before bedtime. This includes: ? Watching TV. ? Using your smartphone, tablet, or computer.  Stick to a routine that includes going to bed and waking up at the same times every day and night. This can help you fall asleep faster. Consider making a quiet activity, such as reading, part of your nighttime routine.  Try to avoid taking naps during the day so that you sleep better at night.  Get out of bed if you are still awake after 15 minutes of trying to sleep. Keep the lights down, but try reading or doing a quiet activity. When you feel sleepy, go back to bed. General instructions  Take over-the-counter and prescription medicines only as told by your health care provider.  Exercise regularly, as told by your health care provider. Avoid exercise starting several hours before bedtime.  Use relaxation techniques to manage stress. Ask your health care provider to suggest some techniques that may work well for you. These may include: ? Breathing exercises. ? Routines to release muscle tension. ? Visualizing peaceful scenes.  Make sure that you drive carefully. Avoid driving if you feel very sleepy.  Keep all follow-up visits as told by your health care provider. This is important. Contact a health care provider if:  You are tired throughout the day.  You have trouble in your daily routine due to sleepiness.  You continue to have sleep problems, or your sleep problems get worse. Get help right away if:  You have serious thoughts about  hurting yourself or someone else. If you ever feel like you may hurt yourself or others, or have thoughts about taking your own life, get help right away. You can go to your nearest emergency department or call:  Your local emergency services (911 in the [place]  A suicide crisis helpline, such as the [place] at 424-750-8179. This is open 24 hours a day. Summary  Insomnia is a sleep disorder that makes it difficult to fall asleep or stay asleep.  Insomnia can be long-term (chronic) or short-term (acute).  Treatment for insomnia depends on the cause. Treatment may focus on treating an underlying condition that is causing insomnia.  Keep a sleep diary to help you and your health care provider figure out what could be causing your insomnia. This information is not intended to replace advice given to you by your health care provider. Make sure you discuss any questions you have with your health care provider. Document Revised: 12/28/2016 Document Reviewed: 10/25/2016 Elsevier Patient Education  2020 [place], Adult Stress is a normal reaction to life events. Stress is what you feel when life demands more than you are used to, or more than you think you can handle. Some stress can be useful, such as studying for a test or meeting a deadline at work. Stress that occurs too often or for too long can cause problems. It can affect your emotional health and interfere with relationships and normal daily activities. Too much stress can weaken your body's defense system (immune system) and increase your risk for physical illness. If you already have a medical problem, stress can make it worse. What are the causes? All sorts of life events can cause stress. An event that causes stress for one person may not be stressful for another person. Major life events, whether positive or negative, commonly cause stress. Examples include:  Losing a job or starting a new  job.  Losing a loved one.  Moving to a new town or home.  Getting married or divorced.  Having a baby.  Getting injured or sick. Less obvious life events can also cause stress, especially if they occur day after day or in combination with each other. Examples include:  Working long hours.  Driving in traffic.  Caring for children.  Being in  debt.  Being in a difficult relationship. What are the signs or symptoms? Stress can cause emotional symptoms, including:  Anxiety. This is feeling worried, afraid, on edge, overwhelmed, or out of control.  Anger, including irritation or impatience.  Depression. This is feeling sad, down, helpless, or guilty.  Trouble focusing, remembering, or making decisions. Stress can cause physical symptoms, including:  Aches and pains. These may affect your head, neck, back, stomach, or other areas of your body.  Tight muscles or a clenched jaw.  Low energy.  Trouble sleeping. Stress can cause unhealthy behaviors, including:  Eating to feel better (overeating) or skipping meals.  Working too much or putting off tasks.  Smoking, drinking alcohol, or using drugs to feel better. How is this diagnosed? Stress is diagnosed through an assessment by your health care provider. He or she may diagnose this condition based on:  Your symptoms and any stressful life events.  Your medical history.  Tests to rule out other causes of your symptoms. Depending on your condition, your health care provider may refer you to a specialist for further evaluation. How is this treated?  Stress management techniques are the recommended treatment for stress. Medicine is not typically recommended for the treatment of stress. Techniques to reduce your reaction to stressful life events include:  Stress identification. Monitor yourself for symptoms of stress and identify what causes stress for you. These skills may help you to avoid or prepare for stressful  events.  Time management. Set your priorities, keep a calendar of events, and learn to say no. Taking these actions can help you avoid making too many commitments. Techniques for coping with stress include:  Rethinking the problem. Try to think realistically about stressful events rather than ignoring them or overreacting. Try to find the positives in a stressful situation rather than focusing on the negatives.  Exercise. Physical exercise can release both physical and emotional tension. The key is to find a form of exercise that you enjoy and do it regularly.  Relaxation techniques. These relax the body and mind. The key is to find one or more that you enjoy and use the techniques regularly. Examples include: ? Meditation, deep breathing, or progressive relaxation techniques. ? Yoga or tai chi. ? Biofeedback, mindfulness techniques, or journaling. ? Listening to music, being out in nature, or participating in other hobbies.  Practicing a healthy lifestyle. Eat a balanced diet, drink plenty of water, limit or avoid caffeine, and get plenty of sleep.  Having a strong support network. Spend time with family, friends, or other people you enjoy being around. Express your feelings and talk things over with someone you trust. Counseling or talk therapy with a mental health professional may be helpful if you are having trouble managing stress on your own. Follow these instructions at home: Lifestyle   Avoid drugs.  Do not use any products that contain nicotine or tobacco, such as cigarettes, e-cigarettes, and chewing tobacco. If you need help quitting, ask your health care provider.  Limit alcohol intake to no more than 1 drink a day for nonpregnant women and 2 drinks a day for men. One drink equals 12 oz of beer, 5 oz of wine, or 1 oz of hard liquor  Do not use alcohol or drugs to relax.  Eat a balanced diet that includes fresh fruits and vegetables, whole grains, lean meats, fish, eggs,  and beans, and low-fat dairy. Avoid processed foods and foods high in added fat, sugar, and salt.  Exercise at  least 30 minutes on 5 or more days each week.  Get 7-8 hours of sleep each night. General instructions   Practice stress management techniques as discussed with your health care provider.  Drink enough fluid to keep your urine clear or pale yellow.  Take over-the-counter and prescription medicines only as told by your health care provider.  Keep all follow-up visits as told by your health care provider. This is important. Contact a health care provider if:  Your symptoms get worse.  You have new symptoms.  You feel overwhelmed by your problems and can no longer manage them on your own. Get help right away if:  You have thoughts of hurting yourself or others. If you ever feel like you may hurt yourself or others, or have thoughts about taking your own life, get help right away. You can go to your nearest emergency department or call:  Your local emergency services (911 in the [place]  A suicide crisis helpline, such as the [facility] at 6514778567. This is open 24 hours a day. Summary  Stress is a normal reaction to life events. It can cause problems if it happens too often or for too long.  Practicing stress management techniques is the best way to treat stress.  Counseling or talk therapy with a mental health professional may be helpful if you are having trouble managing stress on your own. This information is not intended to replace advice given to you by your health care provider. Make sure you discuss any questions you have with your health care provider. Document Revised: 08/15/2018 Document Reviewed: 03/07/2016 Elsevier Patient Education  [employer].       If you have lab work done today you will be contacted with your lab results within the next 2 weeks.  If you have not heard from [place] then please contact us. The  fastest way to get your results is to register for My Chart.   IF you received an x-ray today, you will receive an invoice from [facility]. Please contact [facility] at [phone] with questions or concerns regarding your invoice.   IF you received labwork today, you will receive an invoice from [place]. Please contact [facility] at [phone] with questions or concerns regarding your invoice.   Our billing staff will not be able to assist you with questions regarding bills from these companies.  You will be contacted with the lab results as soon as they are available. The fastest way to get your results is to activate your My Chart account. Instructions are located on the last page of this paperwork. If you have not heard from [place] regarding the results in 2 weeks, please contact this office.         Signed, Merri Ray, MD [facility]

## 2019-07-04 ENCOUNTER — Encounter: Payer: Self-pay | Admitting: Family Medicine

## 2019-09-14 DIAGNOSIS — R350 Frequency of micturition: Secondary | ICD-10-CM | POA: Diagnosis not present

## 2019-09-14 DIAGNOSIS — N401 Enlarged prostate with lower urinary tract symptoms: Secondary | ICD-10-CM | POA: Diagnosis not present

## 2019-09-14 DIAGNOSIS — R972 Elevated prostate specific antigen [PSA]: Secondary | ICD-10-CM | POA: Diagnosis not present

## 2019-11-04 DIAGNOSIS — C61 Malignant neoplasm of prostate: Secondary | ICD-10-CM | POA: Diagnosis not present

## 2019-11-04 DIAGNOSIS — R972 Elevated prostate specific antigen [PSA]: Secondary | ICD-10-CM | POA: Diagnosis not present

## 2019-11-21 ENCOUNTER — Other Ambulatory Visit: Payer: Self-pay | Admitting: Family Medicine

## 2019-11-21 DIAGNOSIS — F439 Reaction to severe stress, unspecified: Secondary | ICD-10-CM

## 2019-11-21 DIAGNOSIS — E785 Hyperlipidemia, unspecified: Secondary | ICD-10-CM

## 2019-11-23 NOTE — Telephone Encounter (Signed)
Patient is requesting a refill of the following medications: Requested Prescriptions   Pending Prescriptions Disp Refills   atorvastatin (LIPITOR) 10 MG tablet 90 tablet 0    Sig: Take 1 tablet (10 mg total) by mouth daily.   ALPRAZolam (XANAX) 0.5 MG tablet 30 tablet 0    Sig: Take 1 tablet (0.5 mg total) by mouth 2 (two) times daily as needed for anxiety (or stress.).    Date of patient request: 11/23/2019 Last office visit: 07/03/2019 Date of last refill: 07/03/2019 Last refill amount: 30 Follow up time period per chart: 3-6 months

## 2019-11-24 MED ORDER — ALPRAZOLAM 0.5 MG PO TABS: 0.5000 mg | ORAL_TABLET | Freq: Two times a day (BID) | ORAL | 0 refills | Status: DC | PRN

## 2019-11-24 MED ORDER — ATORVASTATIN CALCIUM 10 MG PO TABS: 10.0000 mg | ORAL_TABLET | Freq: Every day | ORAL | 0 refills | Status: DC

## 2019-11-24 NOTE — Telephone Encounter (Signed)
.  Controlled substance database (PDMP) reviewed. No concerns appreciated.  Last filled 07/03/2019.  Prescriptions refilled.

## 2019-12-15 ENCOUNTER — Encounter: Payer: Self-pay | Admitting: Family Medicine

## 2019-12-27 ENCOUNTER — Encounter: Payer: Self-pay | Admitting: Family Medicine

## 2019-12-27 DIAGNOSIS — F439 Reaction to severe stress, unspecified: Secondary | ICD-10-CM

## 2019-12-27 DIAGNOSIS — F418 Other specified anxiety disorders: Secondary | ICD-10-CM

## 2019-12-28 NOTE — Telephone Encounter (Signed)
Patient is requesting a refill of the following medications: Requested Prescriptions   Pending Prescriptions Disp Refills  . FLUoxetine (PROZAC) 20 MG tablet 90 tablet 3    Sig: Take 1 tablet (20 mg total) by mouth daily.    Date of patient request: 12/28/2019 Last office visit: 07/03/2019 Date of last refill: 07/03/2019 Last refill amount: 90 tab x 2  Follow up time period per chart: 3-6 months

## 2019-12-29 MED ORDER — FLUOXETINE HCL 20 MG PO TABS: 20.0000 mg | ORAL_TABLET | Freq: Every day | ORAL | 3 refills | Status: DC

## 2019-12-30 ENCOUNTER — Ambulatory Visit (INDEPENDENT_AMBULATORY_CARE_PROVIDER_SITE_OTHER): Payer: BC Managed Care – PPO | Admitting: Family Medicine

## 2019-12-30 ENCOUNTER — Other Ambulatory Visit: Payer: Self-pay

## 2019-12-30 DIAGNOSIS — R7989 Other specified abnormal findings of blood chemistry: Secondary | ICD-10-CM | POA: Diagnosis not present

## 2019-12-30 DIAGNOSIS — R972 Elevated prostate specific antigen [PSA]: Secondary | ICD-10-CM | POA: Diagnosis not present

## 2019-12-30 DIAGNOSIS — Z13220 Encounter for screening for lipoid disorders: Secondary | ICD-10-CM

## 2019-12-31 ENCOUNTER — Encounter: Payer: Self-pay | Admitting: Family Medicine

## 2019-12-31 LAB — CMP14+EGFR
ALT: 29 IU/L (ref 0–44)
AST: 24 IU/L (ref 0–40)
Albumin/Globulin Ratio: 1.7 (ref 1.2–2.2)
Albumin: 4.4 g/dL (ref 3.8–4.9)
Alkaline Phosphatase: 76 IU/L (ref 44–121)
BUN/Creatinine Ratio: 14 (ref 9–20)
BUN: 16 mg/dL (ref 6–24)
Bilirubin Total: 0.6 mg/dL (ref 0.0–1.2)
CO2: 23 mmol/L (ref 20–29)
Calcium: 9.2 mg/dL (ref 8.7–10.2)
Chloride: 104 mmol/L (ref 96–106)
Creatinine, Ser: 1.14 mg/dL (ref 0.76–1.27)
GFR calc Af Amer: 82 mL/min/{1.73_m2} (ref >59)
GFR calc non Af Amer: 71 mL/min/{1.73_m2} (ref >59)
Globulin, Total: 2.6 g/dL (ref 1.5–4.5)
Glucose: 87 mg/dL (ref 65–99)
Potassium: 4.3 mmol/L (ref 3.5–5.2)
Sodium: 140 mmol/L (ref 134–144)
Total Protein: 7 g/dL (ref 6.0–8.5)

## 2019-12-31 LAB — LIPID PANEL
Chol/HDL Ratio: 3.9 ratio (ref 0.0–5.0)
Cholesterol, Total: 175 mg/dL (ref 100–199)
HDL: 45 mg/dL (ref >39)
LDL Chol Calc (NIH): 106 mg/dL — ABNORMAL HIGH (ref 0–99)
Triglycerides: 133 mg/dL (ref 0–149)
VLDL Cholesterol Cal: 24 mg/dL (ref 5–40)

## 2019-12-31 LAB — PSA: Prostate Specific Ag, Serum: 5.3 ng/mL — ABNORMAL HIGH (ref 0.0–4.0)

## 2020-01-18 DIAGNOSIS — N401 Enlarged prostate with lower urinary tract symptoms: Secondary | ICD-10-CM | POA: Diagnosis not present

## 2020-01-18 DIAGNOSIS — C61 Malignant neoplasm of prostate: Secondary | ICD-10-CM | POA: Diagnosis not present

## 2020-01-18 DIAGNOSIS — R350 Frequency of micturition: Secondary | ICD-10-CM | POA: Diagnosis not present

## 2020-02-09 ENCOUNTER — Encounter: Payer: Self-pay | Admitting: Family Medicine

## 2020-02-09 DIAGNOSIS — C61 Malignant neoplasm of prostate: Secondary | ICD-10-CM | POA: Insufficient documentation

## 2020-03-22 ENCOUNTER — Other Ambulatory Visit: Payer: Self-pay | Admitting: Emergency Medicine

## 2020-03-22 ENCOUNTER — Other Ambulatory Visit: Payer: Self-pay | Admitting: Family Medicine

## 2020-03-22 DIAGNOSIS — F418 Other specified anxiety disorders: Secondary | ICD-10-CM

## 2020-03-22 DIAGNOSIS — F439 Reaction to severe stress, unspecified: Secondary | ICD-10-CM

## 2020-03-22 MED ORDER — FLUOXETINE HCL 20 MG PO TABS: 20.0000 mg | ORAL_TABLET | Freq: Every day | ORAL | 3 refills | Status: DC

## 2020-03-22 MED ORDER — ALPRAZOLAM 0.5 MG PO TABS: 0.5000 mg | ORAL_TABLET | Freq: Two times a day (BID) | ORAL | 0 refills | Status: DC | PRN

## 2020-03-22 NOTE — Telephone Encounter (Signed)
Patient is requesting a refill of the following medications: Requested Prescriptions   Pending Prescriptions Disp Refills   ALPRAZolam (XANAX) 0.5 MG tablet 30 tablet 0    Sig: Take 1 tablet (0.5 mg total) by mouth 2 (two) times daily as needed for anxiety (or stress.).    Date of patient request: 03/22/2020 Last office visit: 12/30/2019 Date of last refill: 11/24/2019 Last refill amount: 30 Follow up time period per chart: 3-6 months

## 2020-03-22 NOTE — Telephone Encounter (Signed)
Controlled substance database (PDMP) reviewed. No concerns appreciated.  rx refilled.

## 2020-04-20 ENCOUNTER — Ambulatory Visit: Payer: BC Managed Care – PPO | Admitting: Family Medicine

## 2020-04-20 ENCOUNTER — Encounter: Payer: Self-pay | Admitting: Family Medicine

## 2020-04-20 ENCOUNTER — Other Ambulatory Visit: Payer: Self-pay

## 2020-04-20 VITALS — BP 109/75 | HR 66 | Temp 98.0°F | Ht 70.0 in | Wt 226.0 lb

## 2020-04-20 DIAGNOSIS — M25512 Pain in left shoulder: Secondary | ICD-10-CM | POA: Diagnosis not present

## 2020-04-20 DIAGNOSIS — C61 Malignant neoplasm of prostate: Secondary | ICD-10-CM

## 2020-04-20 DIAGNOSIS — M25511 Pain in right shoulder: Secondary | ICD-10-CM

## 2020-04-20 MED ORDER — MELOXICAM 7.5 MG PO TABS: 7.5000 mg | ORAL_TABLET | Freq: Every day | ORAL | 0 refills | Status: AC

## 2020-04-20 NOTE — Patient Instructions (Addendum)
Shoulder pain could be some inflammation within the rotator cuff tendons or bursitis but overall your exam was reassuring today.  Can try meloxicam once per day for the next few weeks, do not take that with Advil or Aleve, but okay to use Tylenol.  Range of motion and stretches okay for now but avoid weights or repetitive activity with your shoulder/repetitive overhead activities for now.  Give me an update in the next few weeks, and I am happy to refer you to orthopedics if that area is not improving to decide the next step, possibly physical therapy.  Keep follow-up with urology as planned, but certainly let me know if I can help with other referrals as needed or questions after that visit.  Rotator Cuff Tendinitis  Rotator cuff tendinitis is inflammation of the tendons in the rotator cuff. Tendons are tough, cord-like bands that connect muscle to bone. The rotator cuff includes all of the muscles and tendons that connect the arm to the shoulder. The rotator cuff holds the head of the humerus, or the upper arm bone, in the cup of the shoulder blade (scapula). This condition can lead to a long-term or chronic tear. The tear may be partial or complete. What are the causes? This condition is usually caused by overusing the rotator cuff. What increases the risk? This condition is more likely to develop in athletes and workers who frequently use their shoulder or reach over their heads. This can include activities such as:  Tennis.  Baseball or softball.  Swimming.  Construction work.  Painting. What are the signs or symptoms? Symptoms of this condition include:  Pain that spreads (radiates) from the shoulder to the upper arm.  Swelling and tenderness in front of the shoulder.  Pain when reaching, pulling, or lifting the arm above the head.  Pain when lowering the arm from above the head.  Minor pain in the shoulder when resting.  Increased pain in the shoulder at  night.  Difficulty placing the arm behind the back. How is this diagnosed? This condition is diagnosed with a physical exam and medical history. Tests may also be done, including:  X-rays.  MRI.  Ultrasound.  CT with or without contrast. How is this treated? Treatment for this condition depends on the severity of the condition. In less severe cases, treatment may include:  Rest. This may be done with a sling that holds the shoulder still (immobilization). Your health care provider may also recommend avoiding activities that involve lifting your arm over your head.  Icing the shoulder.  Anti-inflammatory medicines, such as aspirin or ibuprofen. In more severe cases, treatment may include:  Physical therapy.  Steroid injections.  Surgery. Follow these instructions at home: If you have a sling:  Wear the sling as told by your health care provider. Remove it only as told by your health care provider.  Loosen it if your fingers tingle, become numb, or turn cold and blue.  Keep it clean.  If the sling is not waterproof: ? Do not let it get wet. ? Cover it with a watertight covering when you take a bath or shower. Managing pain, stiffness, and swelling  If directed, put ice on the injured area. To do this: ? If you have a removable sling, remove it as told by your health care provider. ? Put ice in a plastic bag. ? Place a towel between your skin and the bag. ? Leave the ice on for 20 minutes, 2-3 times a day.  Move your fingers often to reduce stiffness and swelling.  Raise (elevate) the injured area above the level of your heart while you are lying down.  Find a comfortable sleeping position, or sleep in a recliner, if available.   Activity  Rest your shoulder as told by your health care provider.  Ask your health care provider when it is safe to drive if you have a sling on your arm.  Return to your normal activities as told by your health care provider. Ask  your health care provider what activities are safe for you.  Do any exercises or stretches as told by your health care provider or physical therapist.  If you do repetitive overhead tasks, take small breaks in between and include stretching exercises as told by your health care provider. General instructions  Do not use any products that contain nicotine or tobacco, such as cigarettes, e-cigarettes, and chewing tobacco. These can delay healing. If you need help quitting, ask your health care provider.  Take over-the-counter and prescription medicines only as told by your health care provider.  Keep all follow-up visits as told by your health care provider. This is important. Contact a health care provider if:  Your pain gets worse.  You have new pain in your arm, hands, or fingers.  Your pain is not relieved with medicine or does not get better after 6 weeks of treatment.  You have crackling sensations when moving your shoulder in certain directions.  You hear a snapping sound after using your shoulder, followed by severe pain and weakness. Get help right away if:  Your arm, hand, or fingers are numb or tingling.  Your arm, hand, or fingers are swollen or painful or they turn white or blue. Summary  Rotator cuff tendinitis is inflammation of the tendons in the rotator cuff. Tendons are tough, cord-like bands that connect muscle to bone.  This condition is usually caused by overusing the rotator cuff, which includes all of the muscles and tendons that connect the arm to the shoulder.  This condition is more likely to develop in athletes and workers who frequently use their shoulder or reach over their heads.  Treatment generally includes rest, anti-inflammatory medicines, and icing. In some cases, physical therapy and steroid injections may be needed. In severe cases, surgery may be needed. This information is not intended to replace advice given to you by your health care  provider. Make sure you discuss any questions you have with your health care provider. Document Revised: 10/20/2018 Document Reviewed: 10/20/2018 Elsevier Patient Education  2021 [place].  Shoulder Pain Many things can cause shoulder pain, including:  An injury to the shoulder.  Overuse of the shoulder.  Arthritis. The source of the pain can be:  Inflammation.  An injury to the shoulder joint.  An injury to a tendon, ligament, or bone. Follow these instructions at home: Pay attention to changes in your symptoms. Let your health care provider know about them. Follow these instructions to relieve your pain. If you have a sling:  Wear the sling as told by your health care provider. Remove it only as told by your health care provider.  Loosen the sling if your fingers tingle, become numb, or turn cold and blue.  Keep the sling clean.  If the sling is not waterproof: ? Do not let it get wet. Remove it to shower or bathe.  Move your arm as little as possible, but keep your hand moving to prevent swelling. Managing pain,  stiffness, and swelling  If directed, put ice on the painful area: ? Put ice in a plastic bag. ? Place a towel between your skin and the bag. ? Leave the ice on for 20 minutes, 2-3 times per day. Stop applying ice if it does not help with the pain.  Squeeze a soft ball or a foam pad as much as possible. This helps to keep the shoulder from swelling. It also helps to strengthen the arm.   General instructions  Take over-the-counter and prescription medicines only as told by your health care provider.  Keep all follow-up visits as told by your health care provider. This is important. Contact a health care provider if:  Your pain gets worse.  Your pain is not relieved with medicines.  New pain develops in your arm, hand, or fingers. Get help right away if:  Your arm, hand, or fingers: ? Tingle. ? Become numb. ? Become swollen. ? Become  painful. ? Turn white or blue. Summary  Shoulder pain can be caused by an injury, overuse, or arthritis.  Pay attention to changes in your symptoms. Let your health care provider know about them.  This condition may be treated with a sling, ice, and pain medicines.  Contact your health care provider if the pain gets worse or new pain develops. Get help right away if your arm, hand, or fingers tingle or become numb, swollen, or painful.  Keep all follow-up visits as told by your health care provider. This is important. This information is not intended to replace advice given to you by your health care provider. Make sure you discuss any questions you have with your health care provider. Document Revised: 07/30/2017 Document Reviewed: 07/30/2017 Elsevier Patient Education  2021 [employer].     If you have lab work done today you will be contacted with your lab results within the next 2 weeks.  If you have not heard from [place] then please contact us. The fastest way to get your results is to register for My Chart.   IF you received an x-ray today, you will receive an invoice from [facility]. Please contact [facility] at [phone] with questions or concerns regarding your invoice.   IF you received labwork today, you will receive an invoice from [place]. Please contact [facility] at [phone] with questions or concerns regarding your invoice.   Our billing staff will not be able to assist you with questions regarding bills from these companies.  You will be contacted with the lab results as soon as they are available. The fastest way to get your results is to activate your My Chart account. Instructions are located on the last page of this paperwork. If you have not heard from [place] regarding the results in 2 weeks, please contact this office.

## 2020-04-20 NOTE — Progress Notes (Signed)
Subjective:  Patient ID: Matthew Henson, male    DOB: [birth year]  Age: 60 y.o. MRN: [id number]  CC:  Chief Complaint  Patient presents with  . Shoulder Pain    Pt reports pain in both shoulders. Pain is worse in the L. PT hasn't been taking his Lipitor and states this is because he had noticed that when he would take the medication he would have joint pain. Pt states the pain has been going on for 3 months, but the pt hasn't states he hasn't taking the Lipitor in 2 months, but the shoulder pain hasn't gone away.  . patient question    Pt would like the providers thought about his recent prostate cancer diagnosis. Pt wants to know if he needs to have his PSA checked again or not and just the providers over all thoughts. Pt is seeing a urologist for his prostate cancer. Pt's next appt os on 07/12/2020 with urology     HPI Matthew Henson presents for   Bilateral shoulder pain Past 3 months.  Stopped Lipitor 2 months ago as he noticed that may be contributing to joint pain but shoulder pain has persisted.  Left greater than right.  Feels tension in back side, popping feeling. Has woken up from sleep.  NKI.  Some increased exercise and lifting at gym in December - some soreness after. Persistent soreness. Still some light lifting, stretching, dumbells. Tx: advil or alleve - intermittent- most nights, ice, heat. No prior surgery/tx for shoulder.  Occasional night sweat, no fever, no unexplained weight loss.  Left hand dominant.   Prostate cancer: Low risk prostate cancer diagnosed in October 2021 with PSA 5.06, Gleason score 3+3 equal 6, urologist Dr. Junious Silk.  Last visit January 18, 2020 reviewed.  There is treatment options discussed, as well as discussed a second opinion for focal cryo at [facility].  Plan for 6-month follow-up for DRE with PSA prior. Plan for surveillance at this time, with repeat DRE and PSA. Reviewed urology note and plan with patient - plans to follow up in June for repeat testing  and discussion of option and option of second opinion at that time also offered - he was ok with this plan.       History Patient Active Problem List   Diagnosis Date Noted  . Prostate cancer ([place]) 02/09/2020  . Screening for hyperlipidemia 05/23/2016  . Diverticulosis of colon without hemorrhage 12/21/2014  . Bradycardia 11/17/2013  . Depression 10/09/2011  . 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 Depression   . GERD (gastroesophageal reflux disease)   . Other dyspnea and respiratory abnormality    Presumptive diagnosis of exercised induced asthma   Past Surgical History:  Procedure Laterality Date  . None    . VASECTOMY     No Known Allergies Prior to Admission medications   Medication Sig Start Date End Date Taking? Authorizing Provider  ALPRAZolam Duanne Moron) 0.5 MG tablet Take 1 tablet (0.5 mg total) by mouth 2 (two) times daily as needed for anxiety (or stress.). 03/22/20  Yes Wendie Agreste, MD  FLUoxetine (PROZAC) 20 MG tablet Take 1 tablet (20 mg total) by mouth daily. 03/22/20  Yes Wendie Agreste, MD  atorvastatin (LIPITOR) 10 MG tablet Take 1 tablet (10 mg total) by mouth daily. Patient not taking: Reported on 04/20/2020 11/24/19   Wendie Agreste, MD   Social History   Socioeconomic History  . Marital status: Married  Spouse name: Not on file  . Number of children: 2  . Years of education: Not on file  . Highest education level: Not on file  Occupational History    Comment: [employer]  Tobacco Use  . Smoking status: Never Smoker  . Smokeless tobacco: Never Used  . Tobacco comment: Light smoker years ago.  Vaping Use  . Vaping Use: Never used  Substance and Sexual Activity  . Alcohol use: Yes    Alcohol/week: 6.0 standard drinks    Types: 6 Cans of beer per week    Comment: LIGHT BEER  . Drug use: No  . Sexual activity: Yes  Other Topics Concern  . Not on file  Social History Narrative   Lives at home with  wife and 2 children.   Stressful job.     Exercise biking and lift weights   Social Determinants of Health   Financial Resource Strain: Not on file  Food Insecurity: Not on file  Transportation Needs: Not on file  Physical Activity: Not on file  Stress: Not on file  Social Connections: Not on file  Intimate Partner Violence: Not on file    Review of Systems   Objective:   Vitals:   04/20/20 1512  BP: 109/75  Pulse: 66  Temp: 98 F (36.7 C)  TempSrc: Temporal  SpO2: 97%  Weight: 226 lb (102.5 kg)  Height: 5\' 10"  (1.778 m)     Physical Exam Vitals reviewed.  Constitutional:      General: He is not in acute distress.    Appearance: He is well-developed.  HENT:     Head: Normocephalic and atraumatic.  Cardiovascular:     Rate and Rhythm: Normal rate.  Pulmonary:     Effort: Pulmonary effort is normal.  Musculoskeletal:     Comments: C-spine, pain-free range of motion, does not reproduce shoulder symptoms Bilateral shoulder, full range of motion, no focal bony tenderness, nontender on external exam.  Full rotator cuff strength, negative empty can, negative Neer, mildly positive Hawkins on the left, normal on the right.  Skin:    General: Skin is warm and dry.  Neurological:     General: No focal deficit present.     Mental Status: He is alert and oriented to person, place, and time.      37 minutes spent during visit, greater than 50% counseling and assimilation of information, chart review, and discussion of plan.    Assessment & Plan:  Matthew Henson is a 60 y.o. male . Bilateral shoulder pain, unspecified chronicity - Plan: meloxicam (MOBIC) 7.5 MG tablet  -Overall reassuring exam, differential includes rotator cuff tendinosis/tendinitis, mild subacromial bursitis possible based on exam today.  Option of Ortho eval but he would like to try temporary anti-inflammatory range of motion, stretches.  Avoid repetitive lifting/overhead lifting for now.  Trial of  low-dose meloxicam, discussed risks/GI risk with SSRI.  If not improving next few weeks, would recommend eval with Ortho as possible initial PT trial.  Prostate cancer [facility])  -Reviewed notes as above from urology.  Surveillance at this time with repeat testing in June.  I am happy to refer him for second opinion if needed but he plans to discuss different options with his urologist at that time.  Meds ordered this encounter  Medications  . meloxicam (MOBIC) 7.5 MG tablet    Sig: Take 1 tablet (7.5 mg total) by mouth daily.    Dispense:  30 tablet    Refill:  0  Patient Instructions   Shoulder pain could be some inflammation within the rotator cuff tendons or bursitis but overall your exam was reassuring today.  Can try meloxicam once per day for the next few weeks, do not take that with Advil or Aleve, but okay to use Tylenol.  Range of motion and stretches okay for now but avoid weights or repetitive activity with your shoulder/repetitive overhead activities for now.  Give me an update in the next few weeks, and I am happy to refer you to orthopedics if that area is not improving to decide the next step, possibly physical therapy.  Keep follow-up with urology as planned, but certainly let me know if I can help with other referrals as needed or questions after that visit.  Rotator Cuff Tendinitis  Rotator cuff tendinitis is inflammation of the tendons in the rotator cuff. Tendons are tough, cord-like bands that connect muscle to bone. The rotator cuff includes all of the muscles and tendons that connect the arm to the shoulder. The rotator cuff holds the head of the humerus, or the upper arm bone, in the cup of the shoulder blade (scapula). This condition can lead to a long-term or chronic tear. The tear may be partial or complete. What are the causes? This condition is usually caused by overusing the rotator cuff. What increases the risk? This condition is more likely to develop in  athletes and workers who frequently use their shoulder or reach over their heads. This can include activities such as:  Tennis.  Baseball or softball.  Swimming.  Construction work.  Painting. What are the signs or symptoms? Symptoms of this condition include:  Pain that spreads (radiates) from the shoulder to the upper arm.  Swelling and tenderness in front of the shoulder.  Pain when reaching, pulling, or lifting the arm above the head.  Pain when lowering the arm from above the head.  Minor pain in the shoulder when resting.  Increased pain in the shoulder at night.  Difficulty placing the arm behind the back. How is this diagnosed? This condition is diagnosed with a physical exam and medical history. Tests may also be done, including:  X-rays.  MRI.  Ultrasound.  CT with or without contrast. How is this treated? Treatment for this condition depends on the severity of the condition. In less severe cases, treatment may include:  Rest. This may be done with a sling that holds the shoulder still (immobilization). Your health care provider may also recommend avoiding activities that involve lifting your arm over your head.  Icing the shoulder.  Anti-inflammatory medicines, such as aspirin or ibuprofen. In more severe cases, treatment may include:  Physical therapy.  Steroid injections.  Surgery. Follow these instructions at home: If you have a sling:  Wear the sling as told by your health care provider. Remove it only as told by your health care provider.  Loosen it if your fingers tingle, become numb, or turn cold and blue.  Keep it clean.  If the sling is not waterproof: ? Do not let it get wet. ? Cover it with a watertight covering when you take a bath or shower. Managing pain, stiffness, and swelling  If directed, put ice on the injured area. To do this: ? If you have a removable sling, remove it as told by your health care provider. ? Put ice in  a plastic bag. ? Place a towel between your skin and the bag. ? Leave the ice on for 20 minutes,  2-3 times a day.  Move your fingers often to reduce stiffness and swelling.  Raise (elevate) the injured area above the level of your heart while you are lying down.  Find a comfortable sleeping position, or sleep in a recliner, if available.   Activity  Rest your shoulder as told by your health care provider.  Ask your health care provider when it is safe to drive if you have a sling on your arm.  Return to your normal activities as told by your health care provider. Ask your health care provider what activities are safe for you.  Do any exercises or stretches as told by your health care provider or physical therapist.  If you do repetitive overhead tasks, take small breaks in between and include stretching exercises as told by your health care provider. General instructions  Do not use any products that contain nicotine or tobacco, such as cigarettes, e-cigarettes, and chewing tobacco. These can delay healing. If you need help quitting, ask your health care provider.  Take over-the-counter and prescription medicines only as told by your health care provider.  Keep all follow-up visits as told by your health care provider. This is important. Contact a health care provider if:  Your pain gets worse.  You have new pain in your arm, hands, or fingers.  Your pain is not relieved with medicine or does not get better after 6 weeks of treatment.  You have crackling sensations when moving your shoulder in certain directions.  You hear a snapping sound after using your shoulder, followed by severe pain and weakness. Get help right away if:  Your arm, hand, or fingers are numb or tingling.  Your arm, hand, or fingers are swollen or painful or they turn white or blue. Summary  Rotator cuff tendinitis is inflammation of the tendons in the rotator cuff. Tendons are tough, cord-like bands  that connect muscle to bone.  This condition is usually caused by overusing the rotator cuff, which includes all of the muscles and tendons that connect the arm to the shoulder.  This condition is more likely to develop in athletes and workers who frequently use their shoulder or reach over their heads.  Treatment generally includes rest, anti-inflammatory medicines, and icing. In some cases, physical therapy and steroid injections may be needed. In severe cases, surgery may be needed. This information is not intended to replace advice given to you by your health care provider. Make sure you discuss any questions you have with your health care provider. Document Revised: 10/20/2018 Document Reviewed: 10/20/2018 Elsevier Patient Education  2021 [place].  Shoulder Pain Many things can cause shoulder pain, including:  An injury to the shoulder.  Overuse of the shoulder.  Arthritis. The source of the pain can be:  Inflammation.  An injury to the shoulder joint.  An injury to a tendon, ligament, or bone. Follow these instructions at home: Pay attention to changes in your symptoms. Let your health care provider know about them. Follow these instructions to relieve your pain. If you have a sling:  Wear the sling as told by your health care provider. Remove it only as told by your health care provider.  Loosen the sling if your fingers tingle, become numb, or turn cold and blue.  Keep the sling clean.  If the sling is not waterproof: ? Do not let it get wet. Remove it to shower or bathe.  Move your arm as little as possible, but keep your hand moving to  prevent swelling. Managing pain, stiffness, and swelling  If directed, put ice on the painful area: ? Put ice in a plastic bag. ? Place a towel between your skin and the bag. ? Leave the ice on for 20 minutes, 2-3 times per day. Stop applying ice if it does not help with the pain.  Squeeze a soft ball or a foam pad as much  as possible. This helps to keep the shoulder from swelling. It also helps to strengthen the arm.   General instructions  Take over-the-counter and prescription medicines only as told by your health care provider.  Keep all follow-up visits as told by your health care provider. This is important. Contact a health care provider if:  Your pain gets worse.  Your pain is not relieved with medicines.  New pain develops in your arm, hand, or fingers. Get help right away if:  Your arm, hand, or fingers: ? Tingle. ? Become numb. ? Become swollen. ? Become painful. ? Turn white or blue. Summary  Shoulder pain can be caused by an injury, overuse, or arthritis.  Pay attention to changes in your symptoms. Let your health care provider know about them.  This condition may be treated with a sling, ice, and pain medicines.  Contact your health care provider if the pain gets worse or new pain develops. Get help right away if your arm, hand, or fingers tingle or become numb, swollen, or painful.  Keep all follow-up visits as told by your health care provider. This is important. This information is not intended to replace advice given to you by your health care provider. Make sure you discuss any questions you have with your health care provider. Document Revised: 07/30/2017 Document Reviewed: 07/30/2017 Elsevier Patient Education  2021 [employer].     If you have lab work done today you will be contacted with your lab results within the next 2 weeks.  If you have not heard from [place] then please contact us. The fastest way to get your results is to register for My Chart.   IF you received an x-ray today, you will receive an invoice from [facility]. Please contact [facility] at [phone] with questions or concerns regarding your invoice.   IF you received labwork today, you will receive an invoice from [facility]. Please contact [facility] at [phone] with questions or  concerns regarding your invoice.   Our billing staff will not be able to assist you with questions regarding bills from these companies.  You will be contacted with the lab results as soon as they are available. The fastest way to get your results is to activate your My Chart account. Instructions are located on the last page of this paperwork. If you have not heard from [place] regarding the results in 2 weeks, please contact this office.         Signed, Merri Ray, MD [facility]

## 2020-05-08 ENCOUNTER — Encounter: Payer: Self-pay | Admitting: Family Medicine

## 2020-05-09 NOTE — Telephone Encounter (Signed)
Pt reports medication and resting have not helped arm. On last note you states PT or Ortho would be next if no improvements, please advise.

## 2020-05-11 ENCOUNTER — Other Ambulatory Visit: Payer: Self-pay | Admitting: Family Medicine

## 2020-05-11 DIAGNOSIS — M25511 Pain in right shoulder: Secondary | ICD-10-CM

## 2020-05-27 DIAGNOSIS — M7542 Impingement syndrome of left shoulder: Secondary | ICD-10-CM | POA: Diagnosis not present

## 2020-05-27 DIAGNOSIS — M7541 Impingement syndrome of right shoulder: Secondary | ICD-10-CM | POA: Diagnosis not present

## 2020-05-27 DIAGNOSIS — M75112 Incomplete rotator cuff tear or rupture of left shoulder, not specified as traumatic: Secondary | ICD-10-CM | POA: Diagnosis not present

## 2020-06-16 ENCOUNTER — Encounter: Payer: Self-pay | Admitting: Family Medicine

## 2020-06-21 ENCOUNTER — Other Ambulatory Visit: Payer: Self-pay | Admitting: Family Medicine

## 2020-06-21 DIAGNOSIS — F439 Reaction to severe stress, unspecified: Secondary | ICD-10-CM

## 2020-06-21 MED ORDER — ALPRAZOLAM 0.5 MG PO TABS: 0.5000 mg | ORAL_TABLET | Freq: Two times a day (BID) | ORAL | 0 refills | Status: DC | PRN

## 2020-06-21 NOTE — Telephone Encounter (Signed)
Patient is requesting a refill of the following medications: Requested Prescriptions   Pending Prescriptions Disp Refills   ALPRAZolam (XANAX) 0.5 MG tablet 30 tablet 0    Sig: Take 1 tablet (0.5 mg total) by mouth 2 (two) times daily as needed for anxiety (or stress.).    Date of patient request: 06/21/2020 Last office visit: 04/20/20 Date of last refill: 03/22/20 Last refill amount: 30 0R Follow up time period per chart: 6 months

## 2020-06-21 NOTE — Telephone Encounter (Signed)
Discussed at his last physical.  Intermittent use. Controlled substance database (PDMP) reviewed. No concerns appreciated.  Last filled 03/22/2020, previously 11/24/2019, previously 07/03/2019.  Refill ordered.

## 2020-06-24 ENCOUNTER — Telehealth: Payer: Self-pay

## 2020-06-24 DIAGNOSIS — Z130 Encounter for screening for diseases of the blood and blood-forming organs and certain disorders involving the immune mechanism: Secondary | ICD-10-CM

## 2020-06-24 DIAGNOSIS — Z1329 Encounter for screening for other suspected endocrine disorder: Secondary | ICD-10-CM

## 2020-06-24 DIAGNOSIS — C61 Malignant neoplasm of prostate: Secondary | ICD-10-CM

## 2020-06-24 DIAGNOSIS — Z131 Encounter for screening for diabetes mellitus: Secondary | ICD-10-CM

## 2020-06-24 DIAGNOSIS — Z0000 Encounter for general adult medical examination without abnormal findings: Secondary | ICD-10-CM

## 2020-06-24 DIAGNOSIS — E785 Hyperlipidemia, unspecified: Secondary | ICD-10-CM

## 2020-06-24 NOTE — Telephone Encounter (Signed)
Patient is requesting to have labs drawn prior to CPE. Please advise

## 2020-06-25 NOTE — Telephone Encounter (Signed)
Ordered

## 2020-07-12 DIAGNOSIS — C61 Malignant neoplasm of prostate: Secondary | ICD-10-CM | POA: Diagnosis not present

## 2020-07-14 ENCOUNTER — Encounter: Payer: Self-pay | Admitting: Family Medicine

## 2020-07-14 ENCOUNTER — Other Ambulatory Visit: Payer: Self-pay

## 2020-07-14 ENCOUNTER — Ambulatory Visit (INDEPENDENT_AMBULATORY_CARE_PROVIDER_SITE_OTHER): Payer: BC Managed Care – PPO | Admitting: Family Medicine

## 2020-07-14 VITALS — BP 124/72 | HR 53 | Temp 98.4°F | Resp 16 | Ht 70.0 in | Wt 223.6 lb

## 2020-07-14 DIAGNOSIS — Z131 Encounter for screening for diabetes mellitus: Secondary | ICD-10-CM

## 2020-07-14 DIAGNOSIS — Z0000 Encounter for general adult medical examination without abnormal findings: Secondary | ICD-10-CM | POA: Diagnosis not present

## 2020-07-14 DIAGNOSIS — Z130 Encounter for screening for diseases of the blood and blood-forming organs and certain disorders involving the immune mechanism: Secondary | ICD-10-CM | POA: Diagnosis not present

## 2020-07-14 DIAGNOSIS — Z1329 Encounter for screening for other suspected endocrine disorder: Secondary | ICD-10-CM

## 2020-07-14 DIAGNOSIS — G479 Sleep disorder, unspecified: Secondary | ICD-10-CM

## 2020-07-14 DIAGNOSIS — F418 Other specified anxiety disorders: Secondary | ICD-10-CM

## 2020-07-14 DIAGNOSIS — E785 Hyperlipidemia, unspecified: Secondary | ICD-10-CM

## 2020-07-14 DIAGNOSIS — C61 Malignant neoplasm of prostate: Secondary | ICD-10-CM

## 2020-07-14 DIAGNOSIS — F439 Reaction to severe stress, unspecified: Secondary | ICD-10-CM

## 2020-07-14 LAB — CBC WITH DIFFERENTIAL/PLATELET
Basophils Absolute: 0 10*3/uL (ref 0.0–0.1)
Basophils Relative: 0.8 % (ref 0.0–3.0)
Eosinophils Absolute: 0.1 10*3/uL (ref 0.0–0.7)
Eosinophils Relative: 2.1 % (ref 0.0–5.0)
HCT: 41.6 % (ref 39.0–52.0)
Hemoglobin: 14.6 g/dL (ref 13.0–17.0)
Lymphocytes Relative: 31.5 % (ref 12.0–46.0)
Lymphs Abs: 1.8 10*3/uL (ref 0.7–4.0)
MCHC: 35.1 g/dL (ref 30.0–36.0)
MCV: 88.6 fl (ref 78.0–100.0)
Monocytes Absolute: 0.5 10*3/uL (ref 0.1–1.0)
Monocytes Relative: 8.5 % (ref 3.0–12.0)
Neutro Abs: 3.3 10*3/uL (ref 1.4–7.7)
Neutrophils Relative %: 57.1 % (ref 43.0–77.0)
Platelets: 204 10*3/uL (ref 150.0–400.0)
RBC: 4.69 Mil/uL (ref 4.22–5.81)
RDW: 13.5 % (ref 11.5–15.5)
WBC: 5.7 10*3/uL (ref 4.0–10.5)

## 2020-07-14 LAB — LIPID PANEL
Cholesterol: 141 mg/dL (ref 0–200)
HDL: 42.9 mg/dL (ref >39.00)
LDL Cholesterol: 75 mg/dL (ref 0–99)
NonHDL: 98
Total CHOL/HDL Ratio: 3
Triglycerides: 116 mg/dL (ref 0.0–149.0)
VLDL: 23.2 mg/dL (ref 0.0–40.0)

## 2020-07-14 LAB — HEMOGLOBIN A1C: Hgb A1c MFr Bld: 5.3 % (ref 4.6–6.5)

## 2020-07-14 LAB — COMPREHENSIVE METABOLIC PANEL
ALT: 22 U/L (ref 0–53)
AST: 22 U/L (ref 0–37)
Albumin: 4.3 g/dL (ref 3.5–5.2)
Alkaline Phosphatase: 58 U/L (ref 39–117)
BUN: 13 mg/dL (ref 6–23)
CO2: 25 mEq/L (ref 19–32)
Calcium: 9.1 mg/dL (ref 8.4–10.5)
Chloride: 106 mEq/L (ref 96–112)
Creatinine, Ser: 1.06 mg/dL (ref 0.40–1.50)
GFR: 77.79 mL/min (ref >60.00)
Glucose, Bld: 81 mg/dL (ref 70–99)
Potassium: 4.1 mEq/L (ref 3.5–5.1)
Sodium: 140 mEq/L (ref 135–145)
Total Bilirubin: 0.7 mg/dL (ref 0.2–1.2)
Total Protein: 6.6 g/dL (ref 6.0–8.3)

## 2020-07-14 LAB — TSH: TSH: 2.1 u[IU]/mL (ref 0.35–4.50)

## 2020-07-14 MED ORDER — TRAZODONE HCL 50 MG PO TABS: 25.0000 mg | ORAL_TABLET | Freq: Every evening | ORAL | 3 refills | Status: DC | PRN

## 2020-07-14 MED ORDER — FLUOXETINE HCL 20 MG PO TABS: 20.0000 mg | ORAL_TABLET | Freq: Every day | ORAL | 3 refills | Status: DC

## 2020-07-14 MED ORDER — ATORVASTATIN CALCIUM 10 MG PO TABS: 10.0000 mg | ORAL_TABLET | ORAL | 0 refills | Status: DC

## 2020-07-14 NOTE — Progress Notes (Signed)
Subjective:  Patient ID: Matthew Henson, male    DOB: 11/19/1960  Age: 60 y.o. MRN: [id number]  CC:  Chief Complaint  Patient presents with   Annual Exam    Pt doing well reports no concerns at this time     HPI Matthew Henson presents for   Annual physical exam:  Hyperlipidemia: Lipitor 10mg  QOD. Tolerating current dose. Shoulder pain treated by ortho- Dr.  Theda Sers, Laurine Blazer, PA-C Lab Results  Component Value Date   CHOL 175 12/30/2019   HDL 45 12/30/2019   LDLCALC 106 (H) 12/30/2019   TRIG 133 12/30/2019   CHOLHDL 3.9 12/30/2019   Lab Results  Component Value Date   ALT 29 12/30/2019   AST 24 12/30/2019   ALKPHOS 76 12/30/2019   BILITOT 0.6 12/30/2019   Depression: Prozac 20mg  qam. xanax if needed - with flairs of anxiety only at night - 1-2 per week # 15 on 5/24, previously filled 03/22/20 Doing ok. Some stress with prostate CA diagnosis. Tired a lot. Has been tired for years. Would rather be sleeping, not motivated. Better with xanax, but does  Some nights wakes up frequently.  Some snoring. No known pauses.  Sleeping about 4-5 hours per night. Trouble getting to sleep and wakening.  Better when not having to go.  No SI/HI.   Depression screen [facility] 2/9 07/14/2020 04/20/2020 07/03/2019 05/12/2018 05/07/2017  Decreased Interest 0 0 1 0 0  Down, Depressed, Hopeless 0 0 1 0 0  PHQ - 2 Score 0 0 2 0 0  Altered sleeping 1 - 1 - -  Tired, decreased energy 1 - 1 - -  Change in appetite 0 - 1 - -  Feeling bad or failure about yourself  0 - 1 - -  Trouble concentrating 1 - 0 - -  Moving slowly or fidgety/restless 0 - 0 - -  Suicidal thoughts 0 - 1 - -  PHQ-9 Score 3 - 7 - -  Difficult doing work/chores - - Not difficult at all - -   Cancer Screening: Colonoscopy 12/23/17, diverticulosis (no recent flair of diverticulitis), repeat 10 years.  Prostate - diagnosed October 2021 with PSA 5.06, Gleason 3+3 equal 6.  Urologist Dr. Junious Silk with [facility] - had bloodwork few days  ago - appt next Friday. Active surveillance at this time.   Immunization History  Administered Date(s) Administered   Influenza,inj,Quad PF,6+ Mos 12/15/2015   Moderna Sars-Covid-2 Vaccination 04/30/2019, 05/28/2019   Td 01/29/1993   Tdap 05/08/2017  Covid booster - declines.   No results found. Glasses with driving - ongoing follow up with optho.   Dentist every 6 months.   Exercise: 3 days per week. Limited by shoulder and fatigue.        History Patient Active Problem List   Diagnosis Date Noted   Prostate cancer ([place]) 02/09/2020   Screening for hyperlipidemia 05/23/2016   Diverticulosis of colon without hemorrhage 12/21/2014   Bradycardia 11/17/2013   Depression 10/09/2011   Asthma, exercise induced 10/09/2011   Past Medical History:  Diagnosis Date   Depression    GERD (gastroesophageal reflux disease)    Other dyspnea and respiratory abnormality    Presumptive diagnosis of exercised induced asthma   Past Surgical History:  Procedure Laterality Date   None     VASECTOMY     No Known Allergies Prior to Admission medications   Medication Sig Start Date End Date Taking? Authorizing Provider  ALPRAZolam Duanne Moron) 0.5 MG tablet Take 1  tablet (0.5 mg total) by mouth 2 (two) times daily as needed for anxiety (or stress.). 06/21/20  Yes Wendie Agreste, MD  atorvastatin (LIPITOR) 10 MG tablet Take 1 tablet (10 mg total) by mouth daily. 11/24/19  Yes Wendie Agreste, MD  FLUoxetine (PROZAC) 20 MG tablet Take 1 tablet (20 mg total) by mouth daily. 03/22/20  Yes Wendie Agreste, MD  meloxicam (MOBIC) 7.5 MG tablet Take 1 tablet (7.5 mg total) by mouth daily. 04/20/20  Yes Wendie Agreste, MD   Social History   Socioeconomic History   Marital status: Married    Spouse name: Not on file   Number of children: 2   Years of education: Not on file   Highest education level: Not on file  Occupational History    Comment: [employer]  Tobacco Use   Smoking status: Never    Smokeless tobacco: Never   Tobacco comments:    Light smoker years ago.  Vaping Use   Vaping Use: Never used  Substance and Sexual Activity   Alcohol use: Yes    Alcohol/week: 6.0 standard drinks    Types: 6 Cans of beer per week    Comment: LIGHT BEER   Drug use: No   Sexual activity: Yes  Other Topics Concern   Not on file  Social History Narrative   Lives at home with wife and 2 children.   Stressful job.     Exercise biking and lift weights   Social Determinants of Health   Financial Resource Strain: Not on file  Food Insecurity: Not on file  Transportation Needs: Not on file  Physical Activity: Not on file  Stress: Not on file  Social Connections: Not on file  Intimate Partner Violence: Not on file    Review of Systems   Objective:   Vitals:   07/14/20 1058  BP: 124/72  Pulse: (!) 53  Resp: 16  Temp: 98.4 F (36.9 C)  TempSrc: Temporal  SpO2: 94%  Weight: 223 lb 9.6 oz (101.4 kg)  Height: 5\' 10"  (1.778 m)     Physical Exam Vitals reviewed.  Constitutional:      Appearance: He is well-developed.  HENT:     Head: Normocephalic and atraumatic.     Right Ear: External ear normal.     Left Ear: External ear normal.  Eyes:     Conjunctiva/sclera: Conjunctivae normal.     Pupils: Pupils are equal, round, and reactive to light.  Neck:     Thyroid: No thyromegaly.  Cardiovascular:     Rate and Rhythm: Normal rate and regular rhythm.     Heart sounds: Normal heart sounds.  Pulmonary:     Effort: Pulmonary effort is normal. No respiratory distress.     Breath sounds: Normal breath sounds. No wheezing.  Abdominal:     General: There is no distension.     Palpations: Abdomen is soft.     Tenderness: There is no abdominal tenderness.  Musculoskeletal:        General: No tenderness.     Cervical back: Normal range of motion and neck supple.  Lymphadenopathy:     Cervical: No cervical adenopathy.  Skin:    General: Skin is warm and dry.   Neurological:     Mental Status: He is alert and oriented to person, place, and time.     Deep Tendon Reflexes: Reflexes are normal and symmetric.  Psychiatric:        Behavior: Behavior normal.  Assessment & Plan:  Matthew Henson is a 60 y.o. male . Annual physical exam - Plan: CBC with Differential/Platelet, Comprehensive metabolic panel, Hemoglobin A1c, Lipid panel  - -anticipatory guidance as below in AVS, screening labs above. Health maintenance items as above in HPI discussed/recommended as applicable.   Hyperlipidemia, unspecified hyperlipidemia type - Plan: Lipid panel, atorvastatin (LIPITOR) 10 MG tablet  -Tolerating current every other day dosing.  Continue same, check labs.  Screening for diabetes mellitus (DM) - Plan: Comprehensive metabolic panel, Hemoglobin A1c  Screening for thyroid disorder - Plan: TSH  Prostate cancer ([place]) - Plan: CANCELED: PSA  -Continue follow-up with urology, had recent PSA testing with urology and plan to follow-up next week.  Stress - Plan: FLUoxetine (PROZAC) 20 MG tablet Depression with anxiety - Plan: traZODone (DESYREL) 50 MG tablet, FLUoxetine (PROZAC) 20 MG tablet Sleep disturbance - Plan: traZODone (DESYREL) 50 MG tablet  -Difficulty with sleep which is likely impacting mood, depression, fatigue.  No specific symptoms of sleep apnea.  Suspect more psychological insomnia.  Trial of low-dose trazodone 25 to 50 mg nightly, continue fluoxetine, Xanax only if needed.  Other recommendations given regarding stress management, incorporating breaks to the day if possible as well as short episodes of exercise may also be helpful.  Potential additive side effects of meds discussed.  Recheck 2 months, sooner if needed  Screening, anemia, deficiency, iron - Plan: CBC with Differential/Platelet   Meds ordered this encounter  Medications   traZODone (DESYREL) 50 MG tablet    Sig: Take 0.5-1 tablets (25-50 mg total) by mouth at bedtime as needed  for sleep.    Dispense:  30 tablet    Refill:  3   atorvastatin (LIPITOR) 10 MG tablet    Sig: Take 1 tablet (10 mg total) by mouth every other day.    Dispense:  90 tablet    Refill:  0   FLUoxetine (PROZAC) 20 MG tablet    Sig: Take 1 tablet (20 mg total) by mouth daily.    Dispense:  90 tablet    Refill:  3   Patient Instructions  Try trazodone 1/2-1 at bedtime. Continue prozac same dose for now. Xanax if needed.  As sleep improves I expect exercise to be easier. Ok for short intervals of exercise if needed for schedule.  Please let me know if there are questions.  Thanks for coming in today.   Keeping you healthy  Get these tests Blood pressure- Have your blood pressure checked once a year by your healthcare provider.  Normal blood pressure is 120/80 Weight- Have your body mass index (BMI) calculated to screen for obesity.  BMI is a measure of body fat based on height and weight. You can also calculate your own BMI at ViewBanking.si. Cholesterol- Have your cholesterol checked every year. Diabetes- Have your blood sugar checked regularly if you have high blood pressure, high cholesterol, have a family history of diabetes or if you are overweight. Screening for Colon Cancer- Colonoscopy starting at age 52.  Screening may begin sooner depending on your family history and other health conditions. Follow up colonoscopy as directed by your Gastroenterologist.  Take these medicines Flu shot- Every fall. Tetanus- Every 10 years. Pneumonia shot- Once after the age of 35; if you are younger than 86, ask your healthcare provider if you need a Pneumonia shot.  Take these steps Don't smoke- If you do smoke, talk to your doctor about quitting.  For tips on how to quit, go to  www.smokefree.gov or call 1-800-QUIT-NOW. Be physically active- Exercise 5 days a week for at least 30 minutes.  If you are not already physically active start slow and gradually work up to 30 minutes of  moderate physical activity.  Examples of moderate activity include walking briskly, mowing the yard, dancing, swimming, bicycling, etc. Eat a healthy diet- Eat a variety of healthy food such as fruits, vegetables, low fat milk, low fat cheese, yogurt, lean meant, poultry, fish, beans, tofu, etc. For more information go to www.thenutritionsource.org Drink alcohol in moderation- Limit alcohol intake to less than two drinks a day. Never drink and drive. Dentist- Brush and floss twice daily; visit your dentist twice a year. Depression- Your emotional health is as important as your physical health. If you're feeling down, or losing interest in things you would normally enjoy please talk to your healthcare provider. Eye exam- Visit your eye doctor every year. Safe sex- If you may be exposed to a sexually transmitted infection, use a condom. Seat belts- Seat belts can save your life; always wear one. Smoke/Carbon Monoxide detectors- These detectors need to be installed on the appropriate level of your home.  Replace batteries at least once a year. Skin cancer- When out in the sun, cover up and use sunscreen 15 SPF or higher. Violence- If anyone is threatening you, please tell your healthcare provider. Living Will/ Health care power of attorney- Speak with your healthcare provider and family.      Signed,   Merri Ray, MD [place] 07/14/20 12:32 PM

## 2020-07-14 NOTE — Patient Instructions (Addendum)
Try trazodone 1/2-1 at bedtime. Continue prozac same dose for now. Xanax if needed.  As sleep improves I expect exercise to be easier. Ok for short intervals of exercise if needed for schedule.  Please let me know if there are questions.  Thanks for coming in today.   Keeping you healthy  Get these tests Blood pressure- Have your blood pressure checked once a year by your healthcare provider.  Normal blood pressure is 120/80 Weight- Have your body mass index (BMI) calculated to screen for obesity.  BMI is a measure of body fat based on height and weight. You can also calculate your own BMI at ViewBanking.si. Cholesterol- Have your cholesterol checked every year. Diabetes- Have your blood sugar checked regularly if you have high blood pressure, high cholesterol, have a family history of diabetes or if you are overweight. Screening for Colon Cancer- Colonoscopy starting at age 57.  Screening may begin sooner depending on your family history and other health conditions. Follow up colonoscopy as directed by your Gastroenterologist.  Take these medicines Flu shot- Every fall. Tetanus- Every 10 years. Pneumonia shot- Once after the age of 70; if you are younger than 58, ask your healthcare provider if you need a Pneumonia shot.  Take these steps Don't smoke- If you do smoke, talk to your doctor about quitting.  For tips on how to quit, go to www.smokefree.gov or call 1-800-QUIT-NOW. Be physically active- Exercise 5 days a week for at least 30 minutes.  If you are not already physically active start slow and gradually work up to 30 minutes of moderate physical activity.  Examples of moderate activity include walking briskly, mowing the yard, dancing, swimming, bicycling, etc. Eat a healthy diet- Eat a variety of healthy food such as fruits, vegetables, low fat milk, low fat cheese, yogurt, lean meant, poultry, fish, beans, tofu, etc. For more information go to  www.thenutritionsource.org Drink alcohol in moderation- Limit alcohol intake to less than two drinks a day. Never drink and drive. Dentist- Brush and floss twice daily; visit your dentist twice a year. Depression- Your emotional health is as important as your physical health. If you're feeling down, or losing interest in things you would normally enjoy please talk to your healthcare provider. Eye exam- Visit your eye doctor every year. Safe sex- If you may be exposed to a sexually transmitted infection, use a condom. Seat belts- Seat belts can save your life; always wear one. Smoke/Carbon Monoxide detectors- These detectors need to be installed on the appropriate level of your home.  Replace batteries at least once a year. Skin cancer- When out in the sun, cover up and use sunscreen 15 SPF or higher. Violence- If anyone is threatening you, please tell your healthcare provider. Living Will/ Health care power of attorney- Speak with your healthcare provider and family.

## 2020-07-19 ENCOUNTER — Encounter: Payer: Self-pay | Admitting: Family Medicine

## 2020-07-21 ENCOUNTER — Telehealth: Payer: Self-pay

## 2020-07-21 NOTE — Telephone Encounter (Signed)
Called pt to inform him that his physical form is complete but we are unable to send this due to the pt signature not being present, LM for him to pick up copy up front

## 2020-07-22 DIAGNOSIS — C61 Malignant neoplasm of prostate: Secondary | ICD-10-CM | POA: Diagnosis not present

## 2020-07-27 ENCOUNTER — Other Ambulatory Visit: Payer: Self-pay | Admitting: Urology

## 2020-07-27 DIAGNOSIS — C61 Malignant neoplasm of prostate: Secondary | ICD-10-CM

## 2020-08-03 ENCOUNTER — Encounter: Payer: Self-pay | Admitting: Family Medicine

## 2020-08-14 ENCOUNTER — Ambulatory Visit
Admission: RE | Admit: 2020-08-14 | Discharge: 2020-08-14 | Disposition: A | Discharge status: Home / Self Care | Payer: BC Managed Care – PPO | Source: Ambulatory Visit | Attending: Urology | Admitting: Urology

## 2020-08-14 DIAGNOSIS — C61 Malignant neoplasm of prostate: Secondary | ICD-10-CM | POA: Diagnosis not present

## 2020-08-14 MED ORDER — GADOBENATE DIMEGLUMINE 529 MG/ML IV SOLN
20.0000 mL | Freq: Once | INTRAVENOUS | Status: AC | PRN
  Administered 2020-08-14: 20 mL via INTRAVENOUS

## 2020-08-15 ENCOUNTER — Telehealth: Payer: Self-pay

## 2020-08-15 NOTE — Telephone Encounter (Signed)
Forms placed in bin for Dr Carlota Raspberry to sign Provider Screening Form

## 2020-08-16 NOTE — Telephone Encounter (Signed)
I do not recall seeing this form and do not currently have this in my file.

## 2020-10-13 DIAGNOSIS — C61 Malignant neoplasm of prostate: Secondary | ICD-10-CM | POA: Diagnosis not present

## 2020-11-01 DIAGNOSIS — N401 Enlarged prostate with lower urinary tract symptoms: Secondary | ICD-10-CM | POA: Diagnosis not present

## 2020-11-01 DIAGNOSIS — R350 Frequency of micturition: Secondary | ICD-10-CM | POA: Diagnosis not present

## 2020-11-01 DIAGNOSIS — C61 Malignant neoplasm of prostate: Secondary | ICD-10-CM | POA: Diagnosis not present

## 2020-12-07 DIAGNOSIS — C61 Malignant neoplasm of prostate: Secondary | ICD-10-CM | POA: Diagnosis not present

## 2020-12-07 DIAGNOSIS — N4232 Atypical small acinar proliferation of prostate: Secondary | ICD-10-CM | POA: Diagnosis not present

## 2021-01-01 ENCOUNTER — Encounter: Payer: Self-pay | Admitting: Family Medicine

## 2021-02-09 DIAGNOSIS — R059 Cough, unspecified: Secondary | ICD-10-CM | POA: Diagnosis not present

## 2021-04-17 ENCOUNTER — Other Ambulatory Visit: Payer: Self-pay | Admitting: Family Medicine

## 2021-04-17 DIAGNOSIS — E785 Hyperlipidemia, unspecified: Secondary | ICD-10-CM

## 2021-04-18 MED ORDER — ATORVASTATIN CALCIUM 10 MG PO TABS: 10.0000 mg | ORAL_TABLET | ORAL | 0 refills | Status: DC

## 2021-04-20 ENCOUNTER — Other Ambulatory Visit: Payer: Self-pay | Admitting: Family Medicine

## 2021-04-20 ENCOUNTER — Encounter: Payer: Self-pay | Admitting: Family Medicine

## 2021-04-20 DIAGNOSIS — E785 Hyperlipidemia, unspecified: Secondary | ICD-10-CM

## 2021-05-04 ENCOUNTER — Other Ambulatory Visit: Payer: Self-pay | Admitting: Family Medicine

## 2021-05-04 DIAGNOSIS — F439 Reaction to severe stress, unspecified: Secondary | ICD-10-CM

## 2021-05-04 NOTE — Telephone Encounter (Signed)
Patient is requesting a refill of the following medications: ?Requested Prescriptions  ? ?Pending Prescriptions Disp Refills  ? ALPRAZolam (XANAX) 0.5 MG tablet [Pharmacy Med Name: ALPRAZOLAM 0.5 MG TABLET] 30 tablet   ?  Sig: TAKE ONE TABLET BY MOUTH TWICE A DAY AS NEEDED FOR ANXIETY OR STRESS  ? ? ?Date of patient request: 05/04/2021 ?Last office visit: 07/14/2020 ?Date of last refill: 06/21/2020 ?Last refill amount: 30 tablets ?Follow up time period per chart: 07/20/2021 ? ?

## 2021-05-04 NOTE — Telephone Encounter (Signed)
Patient is requesting a refill of the following medications: ?Requested Prescriptions  ? ?Pending Prescriptions Disp Refills  ? ALPRAZolam (XANAX) 0.5 MG tablet 30 tablet 0  ?  Sig: Take 1 tablet (0.5 mg total) by mouth 2 (two) times daily as needed for anxiety (or stress.).  ? ? ?Date of patient request: 05/04/2021 ?Last office visit: 07/14/2020 ?Date of last refill: 06/21/2020 ?Last refill amount: 30 tablets 0 refills  ?Follow up time period per chart: 07/20/2021 ? ?

## 2021-05-05 MED ORDER — ALPRAZOLAM 0.5 MG PO TABS: 0.5000 mg | ORAL_TABLET | Freq: Two times a day (BID) | ORAL | 0 refills | Status: DC | PRN

## 2021-05-05 NOTE — Telephone Encounter (Signed)
Intermittent low-dose Xanax discussed at his physical in June 2022.  Controlled substance database reviewed.  Last filled Jun 21, 2020 for #30.  Refill ordered. ?

## 2021-05-08 ENCOUNTER — Other Ambulatory Visit: Payer: Self-pay | Admitting: Family Medicine

## 2021-05-08 DIAGNOSIS — F418 Other specified anxiety disorders: Secondary | ICD-10-CM

## 2021-05-08 DIAGNOSIS — G479 Sleep disorder, unspecified: Secondary | ICD-10-CM

## 2021-05-09 NOTE — Telephone Encounter (Signed)
Patient is requesting a refill of the following medications: ?Requested Prescriptions  ? ?Pending Prescriptions Disp Refills  ? traZODone (DESYREL) 50 MG tablet [Pharmacy Med Name: traZODone 50 MG TABLET] 30 tablet 3  ?  Sig: TAKE 0.5-1 TABLET BY MOUTH EVERY NIGHT AT BEDTIME AS NEEDED FOR SLEEP  ? ? ?Date of patient request: 05/09/21 ?Last office visit: 07/14/20 ?Date of last refill: 07/14/20 ?Last refill amount: 30 x3 ? ? ?

## 2021-06-02 DIAGNOSIS — C61 Malignant neoplasm of prostate: Secondary | ICD-10-CM | POA: Diagnosis not present

## 2021-06-20 ENCOUNTER — Other Ambulatory Visit: Payer: Self-pay | Admitting: Family Medicine

## 2021-06-20 DIAGNOSIS — E785 Hyperlipidemia, unspecified: Secondary | ICD-10-CM

## 2021-06-20 MED ORDER — ATORVASTATIN CALCIUM 10 MG PO TABS: 10.0000 mg | ORAL_TABLET | ORAL | 0 refills | Status: DC

## 2021-07-12 DIAGNOSIS — R350 Frequency of micturition: Secondary | ICD-10-CM | POA: Diagnosis not present

## 2021-07-12 DIAGNOSIS — N401 Enlarged prostate with lower urinary tract symptoms: Secondary | ICD-10-CM | POA: Diagnosis not present

## 2021-07-12 DIAGNOSIS — C61 Malignant neoplasm of prostate: Secondary | ICD-10-CM | POA: Diagnosis not present

## 2021-07-20 ENCOUNTER — Encounter: Payer: Self-pay | Admitting: Family Medicine

## 2021-07-20 ENCOUNTER — Ambulatory Visit (INDEPENDENT_AMBULATORY_CARE_PROVIDER_SITE_OTHER): Payer: BC Managed Care – PPO | Admitting: Family Medicine

## 2021-07-20 VITALS — BP 134/70 | HR 52 | Temp 98.1°F | Resp 16 | Ht 70.0 in | Wt 224.0 lb

## 2021-07-20 DIAGNOSIS — M545 Low back pain, unspecified: Secondary | ICD-10-CM

## 2021-07-20 DIAGNOSIS — Z131 Encounter for screening for diabetes mellitus: Secondary | ICD-10-CM

## 2021-07-20 DIAGNOSIS — C61 Malignant neoplasm of prostate: Secondary | ICD-10-CM | POA: Diagnosis not present

## 2021-07-20 DIAGNOSIS — Z0000 Encounter for general adult medical examination without abnormal findings: Secondary | ICD-10-CM

## 2021-07-20 DIAGNOSIS — F418 Other specified anxiety disorders: Secondary | ICD-10-CM

## 2021-07-20 DIAGNOSIS — F439 Reaction to severe stress, unspecified: Secondary | ICD-10-CM

## 2021-07-20 DIAGNOSIS — K429 Umbilical hernia without obstruction or gangrene: Secondary | ICD-10-CM | POA: Diagnosis not present

## 2021-07-20 DIAGNOSIS — G479 Sleep disorder, unspecified: Secondary | ICD-10-CM

## 2021-07-20 DIAGNOSIS — E785 Hyperlipidemia, unspecified: Secondary | ICD-10-CM

## 2021-07-20 DIAGNOSIS — Z0001 Encounter for general adult medical examination with abnormal findings: Secondary | ICD-10-CM

## 2021-07-20 DIAGNOSIS — W57XXXA Bitten or stung by nonvenomous insect and other nonvenomous arthropods, initial encounter: Secondary | ICD-10-CM

## 2021-07-20 DIAGNOSIS — S70361A Insect bite (nonvenomous), right thigh, initial encounter: Secondary | ICD-10-CM

## 2021-07-20 LAB — HEMOGLOBIN A1C: Hgb A1c MFr Bld: 5.2 % (ref 4.6–6.5)

## 2021-07-20 LAB — LIPID PANEL
Cholesterol: 185 mg/dL (ref 0–200)
HDL: 45.7 mg/dL (ref >39.00)
LDL Cholesterol: 113 mg/dL — ABNORMAL HIGH (ref 0–99)
NonHDL: 139.35
Total CHOL/HDL Ratio: 4
Triglycerides: 133 mg/dL (ref 0.0–149.0)
VLDL: 26.6 mg/dL (ref 0.0–40.0)

## 2021-07-20 LAB — COMPREHENSIVE METABOLIC PANEL
ALT: 22 U/L (ref 0–53)
AST: 25 U/L (ref 0–37)
Albumin: 4.6 g/dL (ref 3.5–5.2)
Alkaline Phosphatase: 63 U/L (ref 39–117)
BUN: 20 mg/dL (ref 6–23)
CO2: 28 mEq/L (ref 19–32)
Calcium: 9.8 mg/dL (ref 8.4–10.5)
Chloride: 103 mEq/L (ref 96–112)
Creatinine, Ser: 1.21 mg/dL (ref 0.40–1.50)
GFR: 65.89 mL/min (ref >60.00)
Glucose, Bld: 95 mg/dL (ref 70–99)
Potassium: 4.3 mEq/L (ref 3.5–5.1)
Sodium: 138 mEq/L (ref 135–145)
Total Bilirubin: 0.8 mg/dL (ref 0.2–1.2)
Total Protein: 7.7 g/dL (ref 6.0–8.3)

## 2021-07-20 MED ORDER — TRAZODONE HCL 50 MG PO TABS: ORAL_TABLET | ORAL | 3 refills | Status: AC

## 2021-07-20 MED ORDER — ATORVASTATIN CALCIUM 10 MG PO TABS: 10.0000 mg | ORAL_TABLET | Freq: Every day | ORAL | 3 refills | Status: DC

## 2021-07-20 MED ORDER — FLUOXETINE HCL 20 MG PO TABS: 20.0000 mg | ORAL_TABLET | Freq: Every day | ORAL | 3 refills | Status: AC

## 2021-07-20 NOTE — Progress Notes (Signed)
Subjective:  Patient ID: Matthew Henson, male    DOB: 1960-05-10  Age: 61 y.o. MRN: [id number]  CC:  Chief Complaint  Patient presents with   Annual Exam    Pt here for physical is fasting     HPI Matthew Henson presents for Annual Exam  Tick removed last week, R thigh, there less than a day, no rash, no new rash, fever, HA. No bullseye rash.scratched at area.    Umbilical hernia - present for some time - no new pain/sx's. Requests surgical eval to discuss repair.   Hyperlipidemia: Lipitor 10 mg QD, no side effects on daily dosing. Prior shoulder pain gone.  Fasting today.  Some back soreness at times with exercise. Not midline, no leg radiation.  Plans follow up if persistent.  Lab Results  Component Value Date   CHOL 141 07/14/2020   HDL 42.90 07/14/2020   LDLCALC 75 07/14/2020   TRIG 116.0 07/14/2020   CHOLHDL 3 07/14/2020   Lab Results  Component Value Date   ALT 22 07/14/2020   AST 22 07/14/2020   ALKPHOS 58 07/14/2020   BILITOT 0.7 07/14/2020   Depression: Trazodone 50 mg nightly as needed. Not nightly. fluoxetine 20 mg daily.  Xanax as needed for anxiety - rare use. Last filled #30 05/05/21.  Doing well.      07/20/2021    9:10 AM 07/14/2020   11:02 AM 04/20/2020    3:13 PM 07/03/2019    3:27 PM 05/12/2018   11:14 AM  Depression screen PHQ 2/9  Decreased Interest 1 0 0 1 0  Down, Depressed, Hopeless 1 0 0 1 0  PHQ - 2 Score 2 0 0 2 0  Altered sleeping '1 1  1   '$ Tired, decreased energy '1 1  1   '$ Change in appetite 0 0  1   Feeling bad or failure about yourself  0 0  1   Trouble concentrating 0 1  0   Moving slowly or fidgety/restless 0 0  0   Suicidal thoughts 0 0  1   PHQ-9 Score '4 3  7   '$ Difficult doing work/chores    Not difficult at all     Health Maintenance  Topic Date Due   Zoster Vaccines- Shingrix (1 of 2) Never done   COVID-19 Vaccine (3 - Moderna risk series) 08/05/2021 (Originally 06/25/2019)   INFLUENZA VACCINE  08/29/2021   TETANUS/TDAP  05/09/2027    COLONOSCOPY (Pts 45-76yr Insurance coverage will need to be confirmed)  12/24/2027   Hepatitis C Screening  Completed   HIV Screening  Completed   HPV VACCINES  Aged Out  Colonoscopy 12/23/2017, diverticulosis, repeat 10 years Followed by urology for prostate cancer, diagnosed October 2021.  Urologist Dr. EJunious Silk  Active surveillance.  Immunization History  Administered Date(s) Administered   Influenza,inj,Quad PF,6+ Mos 12/15/2015   Moderna Sars-Covid-2 Vaccination 04/30/2019, 05/28/2019   Td 01/29/1993   Tdap 05/08/2017  Covid booster - declined.  Considering shingrix - declined today.   Vision Screening   Right eye Left eye Both eyes  Without correction '20/20 20/50 20/20 '$  With correction     Optho once per year usually - due for appt - will schedule  Dental:every 6 months, crown Monday.   Alcohol: 6 per week or less.   Tobacco: none.   Exercise: 4 days per week, 30-616m. Cardio and weights.    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 Authorizing Provider  ALPRAZolam Duanne Moron) 0.5 MG tablet Take 1 tablet (0.5 mg total) by mouth 2 (two) times daily as needed for anxiety (or stress.). 05/05/21  Yes Wendie Agreste, MD  atorvastatin (LIPITOR) 10 MG tablet Take 1 tablet (10 mg total) by mouth every other day. Patient taking differently: Take 10 mg by mouth daily.  06/20/21  Yes Wendie Agreste, MD  FLUoxetine (PROZAC) 20 MG tablet Take 1 tablet (20 mg total) by mouth daily. 07/14/20  Yes Wendie Agreste, MD  traZODone (DESYREL) 50 MG tablet TAKE 0.5-1 TABLET BY MOUTH EVERY NIGHT AT BEDTIME AS NEEDED FOR SLEEP 05/09/21  Yes Wendie Agreste, MD  meloxicam (MOBIC) 7.5 MG tablet Take 1 tablet (7.5 mg total) by mouth daily. Patient not taking: Reported on 07/20/2021 04/20/20   Wendie Agreste, MD   Social History   Socioeconomic History   Marital status: Married    Spouse name: Not on file   Number of children: 2   Years of education: Not on file   Highest education level: Not on file  Occupational History    Comment: [employer]  Tobacco Use   Smoking status: Never   Smokeless tobacco: Never   Tobacco comments:    Light smoker years ago.  Vaping Use   Vaping Use: Never used  Substance and Sexual Activity   Alcohol use: Yes    Alcohol/week: 6.0 standard drinks of alcohol    Types: 6 Cans of beer per week    Comment: LIGHT BEER   Drug use: No   Sexual activity: Yes  Other Topics Concern   Not on file  Social History Narrative   Lives at home with wife and 2 children.   Stressful job.     Exercise biking and lift weights   Social Determinants of Health   Financial Resource Strain: Not on file  Food Insecurity: Not on file  Transportation Needs: Not on file  Physical Activity: Not on file  Stress: Not on file  Social Connections: Not on file  Intimate Partner Violence: Not on file    Review of Systems 13 point review of systems per patient health survey noted.  Negative other than as indicated above or in HPI.    Objective:   Vitals:   07/20/21 0906  BP: 134/70  Pulse: (!) 52  Resp: 16  Temp: 98.1 F (36.7 C)  TempSrc: Temporal  SpO2: 96%  Weight: 224 lb (101.6 kg)  Height: '5\' 10"'$  (1.778 m)     Physical Exam Vitals reviewed.  Constitutional:      Appearance: He is well-developed.  HENT:     Head: Normocephalic and  atraumatic.     Right Ear: External ear normal.     Left Ear: External ear normal.  Eyes:     Conjunctiva/sclera: Conjunctivae normal.     Pupils: Pupils are equal, round, and reactive to light.  Neck:  Thyroid: No thyromegaly.  Cardiovascular:     Rate and Rhythm: Normal rate and regular rhythm.     Heart sounds: Normal heart sounds.  Pulmonary:     Effort: Pulmonary effort is normal. No respiratory distress.     Breath sounds: Normal breath sounds. No wheezing.  Abdominal:     General: There is no distension.     Palpations: Abdomen is soft.     Tenderness: There is no abdominal tenderness.     Hernia: A hernia (Small umbilical hernia, easily reducible without discomfort, no erythema.) is present.  Musculoskeletal:        General: No tenderness. Normal range of motion.     Cervical back: Normal range of motion and neck supple.     Comments: Low back, paraspinal discomfort lower.  No midline bony tenderness.  Negative seated straight leg raise, able to ambulate without difficulty.  Lymphadenopathy:     Cervical: No cervical adenopathy.  Skin:    General: Skin is warm and dry.     Comments: No rash, small abrasion on the right posterior thigh without surrounding erythema, no target lesions.  No other rash appreciated.  Neurological:     Mental Status: He is alert and oriented to person, place, and time.     Deep Tendon Reflexes: Reflexes are normal and symmetric.  Psychiatric:        Behavior: Behavior normal.        Assessment & Plan:  Matthew Henson is a 61 y.o. male . Annual physical exam - Plan: Comprehensive metabolic panel, Lipid panel, Hemoglobin A1c, CANCELED: PSA  - -anticipatory guidance as below in AVS, screening labs above. Health maintenance items as above in HPI discussed/recommended as applicable.   Screening for diabetes mellitus (DM) - Plan: Hemoglobin A1c  Hyperlipidemia, unspecified hyperlipidemia type - Plan: Comprehensive metabolic panel, Lipid panel,  atorvastatin (LIPITOR) 10 MG tablet  -  Stable, tolerating current regimen. Medications refilled. Labs pending as above.   Prostate cancer ([place]) - Plan: CANCELED: PSA  - continue urology follow up.   Stress - Plan: FLUoxetine (PROZAC) 20 MG tablet Depression with anxiety - Plan: FLUoxetine (PROZAC) 20 MG tablet, traZODone (DESYREL) 50 MG tablet Sleep disturbance - Plan: traZODone (DESYREL) 50 MG tablet  -Stable with fluoxetine, trazodone as needed, intermittent dose of alprazolam.  Continue same regimen.  Bilateral low back pain without sciatica, unspecified chronicity  -Likely overuse, mechanical low back pain.  Handout given.  RTC precautions if not improving with activity modification and relative rest.  Tick bite of right thigh, initial encounter  -No sign of secondary infection, no systemic symptoms.  Handout given, RTC precautions  Umbilical hernia without obstruction and without gangrene - Plan: Ambulatory referral to General Surgery  -Refer to general surgery to evaluate for treatment options as plans on continued heavy lifting/exercise.  Asymptomatic at present, hernia precautions discussed.  Meds ordered this encounter  Medications   atorvastatin (LIPITOR) 10 MG tablet    Sig: Take 1 tablet (10 mg total) by mouth daily.    Dispense:  90 tablet    Refill:  3   FLUoxetine (PROZAC) 20 MG tablet    Sig: Take 1 tablet (20 mg total) by mouth daily.    Dispense:  90 tablet    Refill:  3   traZODone (DESYREL) 50 MG tablet    Sig: TAKE 0.5-1 TABLET BY MOUTH EVERY NIGHT AT BEDTIME AS NEEDED FOR SLEEP    Dispense:  30 tablet    Refill:  3   Patient Instructions  No med changes today.  If any rash or new symptoms from tick bite be seen right away. Follow up for back if needed, but see info below.  I will refer you to surgeon for hernia.  Thanks for coming in today.  Return to the clinic or go to the nearest emergency room if any of your symptoms worsen or new symptoms  occur.  Preventive Care 80-23 Years Old, Male Preventive care refers to lifestyle choices and visits with your health care provider that can promote health and wellness. Preventive care visits are also called wellness exams. What can I expect for my preventive care visit? Counseling During your preventive care visit, your health care provider may ask about your: Medical history, including: Past medical problems. Family medical history. Current health, including: Emotional well-being. Home life and relationship well-being. Sexual activity. Lifestyle, including: Alcohol, nicotine or tobacco, and drug use. Access to firearms. Diet, exercise, and sleep habits. Safety issues such as seatbelt and bike helmet use. Sunscreen use. Work and work Statistician. Physical exam Your health care provider will check your: Height and weight. These may be used to calculate your BMI (body mass index). BMI is a measurement that tells if you are at a healthy weight. Waist circumference. This measures the distance around your waistline. This measurement also tells if you are at a healthy weight and may help predict your risk of certain diseases, such as type 2 diabetes and high blood pressure. Heart rate and blood pressure. Body temperature. Skin for abnormal spots. What immunizations do I need?  Vaccines are usually given at various ages, according to a schedule. Your health care provider will recommend vaccines for you based on your age, medical history, and lifestyle or other factors, such as travel or where you work. What tests do I need? Screening Your health care provider may recommend screening tests for certain conditions. This may include: Lipid and cholesterol levels. Diabetes screening. This is done by checking your blood sugar (glucose) after you have not eaten for a while (fasting). Hepatitis B test. Hepatitis C test. HIV (human immunodeficiency virus) test. STI (sexually transmitted  infection) testing, if you are at risk. Lung cancer screening. Prostate cancer screening. Colorectal cancer screening. Talk with your health care provider about your test results, treatment options, and if necessary, the need for more tests. Follow these instructions at home: Eating and drinking  Eat a diet that includes fresh fruits and vegetables, whole grains, lean protein, and low-fat dairy products. Take vitamin and mineral supplements as recommended by your health care provider. Do not drink alcohol if your health care provider tells you not to drink. If you drink alcohol: Limit how much you have to 0-2 drinks a day. Know how much alcohol is in your drink. In the [place] one drink equals one 12 oz bottle of beer (355 mL), one 5 oz glass of wine (148 mL), or one 1 oz glass of hard liquor (44 mL). Lifestyle Brush your teeth every morning and night with fluoride toothpaste. Floss one time each day. Exercise for at least 30 minutes 5 or more days each week. Do not use any products that contain nicotine or tobacco. These products include cigarettes, chewing tobacco, and vaping devices, such as e-cigarettes. If you need help quitting, ask your health care provider. Do not use drugs. If you are sexually active, practice safe sex. Use a condom or other form of protection to prevent STIs. Take aspirin only as told  by your health care provider. Make sure that you understand how much to take and what form to take. Work with your health care provider to find out whether it is safe and beneficial for you to take aspirin daily. Find healthy ways to manage stress, such as: Meditation, yoga, or listening to music. Journaling. Talking to a trusted person. Spending time with friends and family. Minimize exposure to UV radiation to reduce your risk of skin cancer. Safety Always wear your seat belt while driving or riding in a vehicle. Do not drive: If you have been drinking alcohol. Do not ride with  someone who has been drinking. When you are tired or distracted. While texting. If you have been using any mind-altering substances or drugs. Wear a helmet and other protective equipment during sports activities. If you have firearms in your house, make sure you follow all gun safety procedures. What's next? Go to your health care provider once a year for an annual wellness visit. Ask your health care provider how often you should have your eyes and teeth checked. Stay up to date on all vaccines. This information is not intended to replace advice given to you by your health care provider. Make sure you discuss any questions you have with your health care provider. Document Revised: 07/13/2020 Document Reviewed: 07/13/2020 Elsevier Patient Education  [place].   Umbilical Hernia, Adult  A hernia is a bulge of tissue that pushes through an opening between muscles. An umbilical hernia happens in the abdomen, near the belly button (umbilicus). The hernia may contain tissues from the small intestine, large intestine, or fatty tissue covering the intestines. Umbilical hernias in adults tend to get worse over time, and they require surgical treatment. There are different types of umbilical hernias, including: Indirect hernia. This type is located just above or below the umbilicus. It is the most common type of umbilical hernia in adults. Direct hernia. This type forms through an opening formed by the umbilicus. Reducible hernia. This type of hernia comes and goes. It may be visible only when you strain, lift something heavy, or cough. This type of hernia can be pushed back into the abdomen (reduced). Incarcerated hernia. This type traps abdominal tissue inside the hernia. This type of hernia cannot be reduced. Strangulated hernia. This type of hernia cuts off blood flow to the tissues inside the hernia. The tissues can start to die if this happens. This type of hernia requires emergency  treatment. What are the causes? An umbilical hernia happens when tissue inside the abdomen presses on a weak area of the abdominal muscles. What increases the risk? You may have a greater risk of this condition if you: Are obese. Have had several pregnancies. Have a buildup of fluid inside your abdomen. Have had surgery that weakens the abdominal muscles. What are the signs or symptoms? The main symptom of this condition is a painless bulge at or near the belly button. A reducible hernia may be visible only when you strain, lift something heavy, or cough. Other symptoms may include: Dull pain. A feeling of pressure. Symptoms of a strangulated hernia may include: Pain that gets increasingly worse. Nausea and vomiting. Pain when pressing on the hernia. Skin over the hernia becoming red or purple. Constipation. Blood in the stool. How is this diagnosed? This condition may be diagnosed based on: A physical exam. You may be asked to cough or strain while standing. These actions increase the pressure inside your abdomen and can force the  hernia through the opening in your muscles. Your health care provider may try to reduce the hernia by pressing on it. Your symptoms and medical history. How is this treated? Surgery is the only treatment for an umbilical hernia. Surgery for a strangulated hernia is done as soon as possible. If you have a small hernia that is not incarcerated, you may need to lose weight before having surgery. Follow these instructions at home: Lose weight, if told by your health care provider. Do not try to push the hernia back in. Watch your hernia for any changes in color or size. Tell your health care provider if any changes occur. You may need to avoid activities that increase pressure on your hernia. Do not lift anything that is heavier than 10 lb (4.5 kg), or the limit that you are told, until your health care provider says that it is safe. Take over-the-counter and  prescription medicines only as told by your health care provider. Keep all follow-up visits. This is important. Contact a health care provider if: Your hernia gets larger. Your hernia becomes painful. Get help right away if: You develop sudden, severe pain near the area of your hernia. You have pain as well as nausea or vomiting. You have pain and the skin over your hernia changes color. You develop a fever or chills. Summary A hernia is a bulge of tissue that pushes through an opening between muscles. An umbilical hernia happens near the belly button. Surgery is the only treatment for an umbilical hernia. Do not try to push your hernia back in. Keep all follow-up visits. This is important. This information is not intended to replace advice given to you by your health care provider. Make sure you discuss any questions you have with your health care provider. Document Revised: 08/24/2019 Document Reviewed: 08/24/2019 Elsevier Patient Education  [place].   Acute Back Pain, Adult Acute back pain is sudden and usually short-lived. It is often caused by an injury to the muscles and tissues in the back. The injury may result from: A muscle, tendon, or ligament getting overstretched or torn. Ligaments are tissues that connect bones to each other. Lifting something improperly can cause a back strain. Wear and tear (degeneration) of the spinal disks. Spinal disks are circular tissue that provide cushioning between the bones of the spine (vertebrae). Twisting motions, such as while playing sports or doing yard work. A hit to the back. Arthritis. You may have a physical exam, lab tests, and imaging tests to find the cause of your pain. Acute back pain usually goes away with rest and home care. Follow these instructions at home: Managing pain, stiffness, and swelling Take over-the-counter and prescription medicines only as told by your health care provider. Treatment may include  medicines for pain and inflammation that are taken by mouth or applied to the skin, or muscle relaxants. Your health care provider may recommend applying ice during the first 24-48 hours after your pain starts. To do this: Put ice in a plastic bag. Place a towel between your skin and the bag. Leave the ice on for 20 minutes, 2-3 times a day. Remove the ice if your skin turns bright red. This is very important. If you cannot feel pain, heat, or cold, you have a greater risk of damage to the area. If directed, apply heat to the affected area as often as told by your health care provider. Use the heat source that your health care provider recommends, such as a  moist heat pack or a heating pad. Place a towel between your skin and the heat source. Leave the heat on for 20-30 minutes. Remove the heat if your skin turns bright red. This is especially important if you are unable to feel pain, heat, or cold. You have a greater risk of getting burned. Activity  Do not stay in bed. Staying in bed for more than 1-2 days can delay your recovery. Sit up and stand up straight. Avoid leaning forward when you sit or hunching over when you stand. If you work at a desk, sit close to it so you do not need to lean over. Keep your chin tucked in. Keep your neck drawn back, and keep your elbows bent at a 90-degree angle (right angle). Sit high and close to the steering wheel when you drive. Add lower back (lumbar) support to your car seat, if needed. Take short walks on even surfaces as soon as you are able. Try to increase the length of time you walk each day. Do not sit, drive, or stand in one place for more than 30 minutes at a time. Sitting or standing for long periods of time can put stress on your back. Do not drive or use heavy machinery while taking prescription pain medicine. Use proper lifting techniques. When you bend and lift, use positions that put less stress on your back: [place] your knees. Keep the load  close to your body. Avoid twisting. Exercise regularly as told by your health care provider. Exercising helps your back heal faster and helps prevent back injuries by keeping muscles strong and flexible. Work with a physical therapist to make a safe exercise program, as recommended by your health care provider. Do any exercises as told by your physical therapist. Lifestyle Maintain a healthy weight. Extra weight puts stress on your back and makes it difficult to have good posture. Avoid activities or situations that make you feel anxious or stressed. Stress and anxiety increase muscle tension and can make back pain worse. Learn ways to manage anxiety and stress, such as through exercise. General instructions Sleep on a firm mattress in a comfortable position. Try lying on your side with your knees slightly bent. If you lie on your back, put a pillow under your knees. Keep your head and neck in a straight line with your spine (neutral position) when using electronic equipment like smartphones or pads. To do this: Raise your smartphone or pad to look at it instead of bending your head or neck to look down. Put the smartphone or pad at the level of your face while looking at the screen. Follow your treatment plan as told by your health care provider. This may include: Cognitive or behavioral therapy. Acupuncture or massage therapy. Meditation or yoga. Contact a health care provider if: You have pain that is not relieved with rest or medicine. You have increasing pain going down into your legs or buttocks. Your pain does not improve after 2 weeks. You have pain at night. You lose weight without trying. You have a fever or chills. You develop nausea or vomiting. You develop abdominal pain. Get help right away if: You develop new bowel or bladder control problems. You have unusual weakness or numbness in your arms or legs. You feel faint. These symptoms may represent a serious problem that is  an emergency. Do not wait to see if the symptoms will go away. Get medical help right away. Call your local emergency services (911 in the [place]  Do not drive yourself to the hospital. Summary Acute back pain is sudden and usually short-lived. Use proper lifting techniques. When you bend and lift, use positions that put less stress on your back. Take over-the-counter and prescription medicines only as told by your health care provider, and apply heat or ice as told. This information is not intended to replace advice given to you by your health care provider. Make sure you discuss any questions you have with your health care provider. Document Revised: 04/08/2020 Document Reviewed: 04/08/2020 Elsevier Patient Education  [place], Adult Ticks are insects that draw blood for food. Most ticks live in shrubs and grassy and wooded areas. They climb onto people and animals that brush against the leaves and grasses that they rest on. Then they bite, attaching themselves to the skin. Most ticks are harmless, but some ticks may carry germs that can spread to a person through a bite and cause a disease. To reduce your risk of getting a disease from a tick bite, make sure you: Take steps to prevent tick bites. Check for ticks after being outdoors where ticks live. Watch for symptoms of disease if a tick attached to you or if you suspect a tick bite. How can I prevent tick bites? Take these steps to help prevent tick bites when you go outdoors in an area where ticks live: Use insect repellent Use insect repellent that has DEET (20% or higher), picaridin, or IR3535 in it. Follow the instructions on the label. Use these products on: Bare skin. The top of your boots. Your pant legs. Your sleeve cuffs. For insect repellent that contains permethrin, follow the instructions on the label. Use these products on: Clothing. Boots. Outdoor gear. Tents. When you are  outside Wear protective clothing. Long sleeves and long pants offer the best protection from ticks. Wear light-colored clothing so you can see ticks more easily. Tuck your pant legs into your socks. If you go walking on a trail, stay in the middle of the trail so your skin, hair, and clothing do not touch the bushes. Avoid walking through areas with long grass. Check for ticks on your clothing, hair, and skin often while you are outside, and check again before you go inside. Make sure to check the scalp, neck, armpits, waist, groin, and joint areas. These are the spots where ticks attach themselves most often. When you go indoors Check your clothing for ticks. Tumble dry clothes in a dryer on high heat for at least 10 minutes. If clothes are damp, additional time may be needed. If clothes require washing, use hot water. Examine gear and pets. Shower soon after being outdoors. Check your body for ticks. Conduct a full body check using a mirror. What is the proper way to remove a tick? If you find a tick on your body, remove it as soon as possible. Removing a tick sooner can prevent germs from passing to your body. Do not remove the tick with your bare fingers. To remove a tick that is crawling on your skin but has not bitten, use either of these methods: Go outdoors and brush the tick off. Remove the tick with tape or a lint roller. To remove a tick that is attached to your skin: Wash your hands. If you have latex gloves, put them on. Use fine-tipped tweezers, curved forceps, or a tick-removal tool to gently grasp the tick as close to your skin and the tick's head as possible. Gently  pull with a steady, upward, even pressure until the tick lets go. When removing the tick: Take care to keep the tick's head attached to its body. Do not twist or jerk the tick. This can make the tick's head or mouth parts break off and remain in the skin. Do not squeeze or crush the tick's body. This could force  disease-carrying fluids from the tick into your body. Do not try to remove a tick with heat, alcohol, petroleum jelly, or fingernail polish. Using these methods can cause the tick to salivate and regurgitate into your bloodstream, increasing your risk of getting a disease. What should I do after removing a tick? Dispose of the tick. Do not crush a tick with your fingers. Clean the bite area and your hands with soap and water, rubbing alcohol, or an iodine scrub. If an antiseptic cream or ointment is available, apply a small amount to the bite site. Wash and disinfect any instruments that you used to remove the tick. How should I dispose of a tick? To dispose of a live tick, use one of these methods: Place it in rubbing alcohol. Place it in a sealed bag or container. Wrap it tightly in tape. Flush it down the toilet. Contact a health care provider if: You have symptoms of a disease after a tick bite. Symptoms of a tick-borne disease can occur from moments after the tick bites to 30 days after a tick is removed. Symptoms include: Fever or chills. Any of these signs in the bite area: A red rash that makes a circle (bull's-eye rash) in the bite area. Redness and swelling. Headache. Muscle, joint, or bone pain. Abnormal tiredness. Numbness in your legs or difficulty walking or moving your legs. Tender, swollen lymph glands. A part of a tick breaks off and gets stuck in your skin. Get help right away if: You are not able to remove a tick. You experience muscle weakness or paralysis. Your symptoms get worse or you experience new symptoms. You find an engorged tick on your skin and you are in an area where disease from ticks is a high risk. Summary Ticks may carry germs that can spread to a person through a bite and cause a disease. Wear protective clothing and use insect repellent to prevent tick bites. Follow the instructions on the label. If you find a tick on your body, remove it as soon  as possible. If the tick is attached, do not try to remove with heat, alcohol, petroleum jelly, or fingernail polish. Remove the attached tick using fine-tipped tweezers, curved forceps, or a tick-removal tool. Gently pull with steady, upward, even pressure until the tick lets go. Do not twist or jerk the tick. Do not squeeze or crush the tick's body. If you have symptoms of a disease after being bitten by a tick, contact a health care provider. This information is not intended to replace advice given to you by your health care provider. Make sure you discuss any questions you have with your health care provider. Document Revised: 01/12/2019 Document Reviewed: 01/12/2019 [employer] Patient Education  [place],   Merri Ray, MD [place], [employer] 07/20/21 10:25 AM

## 2021-07-20 NOTE — Patient Instructions (Addendum)
No med changes today.  If any rash or new symptoms from tick bite be seen right away. Follow up for back if needed, but see info below.  I will refer you to surgeon for hernia.  Thanks for coming in today.  Return to the clinic or go to the nearest emergency room if any of your symptoms worsen or new symptoms occur.  Preventive Care 4-59 Years Old, Male Preventive care refers to lifestyle choices and visits with your health care provider that can promote health and wellness. Preventive care visits are also called wellness exams. What can I expect for my preventive care visit? Counseling During your preventive care visit, your health care provider may ask about your: Medical history, including: Past medical problems. Family medical history. Current health, including: Emotional well-being. Home life and relationship well-being. Sexual activity. Lifestyle, including: Alcohol, nicotine or tobacco, and drug use. Access to firearms. Diet, exercise, and sleep habits. Safety issues such as seatbelt and bike helmet use. Sunscreen use. Work and work Statistician. Physical exam Your health care provider will check your: Height and weight. These may be used to calculate your BMI (body mass index). BMI is a measurement that tells if you are at a healthy weight. Waist circumference. This measures the distance around your waistline. This measurement also tells if you are at a healthy weight and may help predict your risk of certain diseases, such as type 2 diabetes and high blood pressure. Heart rate and blood pressure. Body temperature. Skin for abnormal spots. What immunizations do I need?  Vaccines are usually given at various ages, according to a schedule. Your health care provider will recommend vaccines for you based on your age, medical history, and lifestyle or other factors, such as travel or where you work. What tests do I need? Screening Your health care provider may recommend  screening tests for certain conditions. This may include: Lipid and cholesterol levels. Diabetes screening. This is done by checking your blood sugar (glucose) after you have not eaten for a while (fasting). Hepatitis B test. Hepatitis C test. HIV (human immunodeficiency virus) test. STI (sexually transmitted infection) testing, if you are at risk. Lung cancer screening. Prostate cancer screening. Colorectal cancer screening. Talk with your health care provider about your test results, treatment options, and if necessary, the need for more tests. Follow these instructions at home: Eating and drinking  Eat a diet that includes fresh fruits and vegetables, whole grains, lean protein, and low-fat dairy products. Take vitamin and mineral supplements as recommended by your health care provider. Do not drink alcohol if your health care provider tells you not to drink. If you drink alcohol: Limit how much you have to 0-2 drinks a day. Know how much alcohol is in your drink. In the [place] one drink equals one 12 oz bottle of beer (355 mL), one 5 oz glass of wine (148 mL), or one 1 oz glass of hard liquor (44 mL). Lifestyle Brush your teeth every morning and night with fluoride toothpaste. Floss one time each day. Exercise for at least 30 minutes 5 or more days each week. Do not use any products that contain nicotine or tobacco. These products include cigarettes, chewing tobacco, and vaping devices, such as e-cigarettes. If you need help quitting, ask your health care provider. Do not use drugs. If you are sexually active, practice safe sex. Use a condom or other form of protection to prevent STIs. Take aspirin only as told by your health care provider. Make sure  that you understand how much to take and what form to take. Work with your health care provider to find out whether it is safe and beneficial for you to take aspirin daily. Find healthy ways to manage stress, such as: Meditation, yoga, or  listening to music. Journaling. Talking to a trusted person. Spending time with friends and family. Minimize exposure to UV radiation to reduce your risk of skin cancer. Safety Always wear your seat belt while driving or riding in a vehicle. Do not drive: If you have been drinking alcohol. Do not ride with someone who has been drinking. When you are tired or distracted. While texting. If you have been using any mind-altering substances or drugs. Wear a helmet and other protective equipment during sports activities. If you have firearms in your house, make sure you follow all gun safety procedures. What's next? Go to your health care provider once a year for an annual wellness visit. Ask your health care provider how often you should have your eyes and teeth checked. Stay up to date on all vaccines. This information is not intended to replace advice given to you by your health care provider. Make sure you discuss any questions you have with your health care provider. Document Revised: 07/13/2020 Document Reviewed: 07/13/2020 Elsevier Patient Education  [place].   Umbilical Hernia, Adult  A hernia is a bulge of tissue that pushes through an opening between muscles. An umbilical hernia happens in the abdomen, near the belly button (umbilicus). The hernia may contain tissues from the small intestine, large intestine, or fatty tissue covering the intestines. Umbilical hernias in adults tend to get worse over time, and they require surgical treatment. There are different types of umbilical hernias, including: Indirect hernia. This type is located just above or below the umbilicus. It is the most common type of umbilical hernia in adults. Direct hernia. This type forms through an opening formed by the umbilicus. Reducible hernia. This type of hernia comes and goes. It may be visible only when you strain, lift something heavy, or cough. This type of hernia can be pushed back into the  abdomen (reduced). Incarcerated hernia. This type traps abdominal tissue inside the hernia. This type of hernia cannot be reduced. Strangulated hernia. This type of hernia cuts off blood flow to the tissues inside the hernia. The tissues can start to die if this happens. This type of hernia requires emergency treatment. What are the causes? An umbilical hernia happens when tissue inside the abdomen presses on a weak area of the abdominal muscles. What increases the risk? You may have a greater risk of this condition if you: Are obese. Have had several pregnancies. Have a buildup of fluid inside your abdomen. Have had surgery that weakens the abdominal muscles. What are the signs or symptoms? The main symptom of this condition is a painless bulge at or near the belly button. A reducible hernia may be visible only when you strain, lift something heavy, or cough. Other symptoms may include: Dull pain. A feeling of pressure. Symptoms of a strangulated hernia may include: Pain that gets increasingly worse. Nausea and vomiting. Pain when pressing on the hernia. Skin over the hernia becoming red or purple. Constipation. Blood in the stool. How is this diagnosed? This condition may be diagnosed based on: A physical exam. You may be asked to cough or strain while standing. These actions increase the pressure inside your abdomen and can force the hernia through the opening in your muscles.  Your health care provider may try to reduce the hernia by pressing on it. Your symptoms and medical history. How is this treated? Surgery is the only treatment for an umbilical hernia. Surgery for a strangulated hernia is done as soon as possible. If you have a small hernia that is not incarcerated, you may need to lose weight before having surgery. Follow these instructions at home: Lose weight, if told by your health care provider. Do not try to push the hernia back in. Watch your hernia for any changes in  color or size. Tell your health care provider if any changes occur. You may need to avoid activities that increase pressure on your hernia. Do not lift anything that is heavier than 10 lb (4.5 kg), or the limit that you are told, until your health care provider says that it is safe. Take over-the-counter and prescription medicines only as told by your health care provider. Keep all follow-up visits. This is important. Contact a health care provider if: Your hernia gets larger. Your hernia becomes painful. Get help right away if: You develop sudden, severe pain near the area of your hernia. You have pain as well as nausea or vomiting. You have pain and the skin over your hernia changes color. You develop a fever or chills. Summary A hernia is a bulge of tissue that pushes through an opening between muscles. An umbilical hernia happens near the belly button. Surgery is the only treatment for an umbilical hernia. Do not try to push your hernia back in. Keep all follow-up visits. This is important. This information is not intended to replace advice given to you by your health care provider. Make sure you discuss any questions you have with your health care provider. Document Revised: 08/24/2019 Document Reviewed: 08/24/2019 Elsevier Patient Education  [place].   Acute Back Pain, Adult Acute back pain is sudden and usually short-lived. It is often caused by an injury to the muscles and tissues in the back. The injury may result from: A muscle, tendon, or ligament getting overstretched or torn. Ligaments are tissues that connect bones to each other. Lifting something improperly can cause a back strain. Wear and tear (degeneration) of the spinal disks. Spinal disks are circular tissue that provide cushioning between the bones of the spine (vertebrae). Twisting motions, such as while playing sports or doing yard work. A hit to the back. Arthritis. You may have a physical exam, lab  tests, and imaging tests to find the cause of your pain. Acute back pain usually goes away with rest and home care. Follow these instructions at home: Managing pain, stiffness, and swelling Take over-the-counter and prescription medicines only as told by your health care provider. Treatment may include medicines for pain and inflammation that are taken by mouth or applied to the skin, or muscle relaxants. Your health care provider may recommend applying ice during the first 24-48 hours after your pain starts. To do this: Put ice in a plastic bag. Place a towel between your skin and the bag. Leave the ice on for 20 minutes, 2-3 times a day. Remove the ice if your skin turns bright red. This is very important. If you cannot feel pain, heat, or cold, you have a greater risk of damage to the area. If directed, apply heat to the affected area as often as told by your health care provider. Use the heat source that your health care provider recommends, such as a moist heat pack or a heating pad.  Place a towel between your skin and the heat source. Leave the heat on for 20-30 minutes. Remove the heat if your skin turns bright red. This is especially important if you are unable to feel pain, heat, or cold. You have a greater risk of getting burned. Activity  Do not stay in bed. Staying in bed for more than 1-2 days can delay your recovery. Sit up and stand up straight. Avoid leaning forward when you sit or hunching over when you stand. If you work at a desk, sit close to it so you do not need to lean over. Keep your chin tucked in. Keep your neck drawn back, and keep your elbows bent at a 90-degree angle (right angle). Sit high and close to the steering wheel when you drive. Add lower back (lumbar) support to your car seat, if needed. Take short walks on even surfaces as soon as you are able. Try to increase the length of time you walk each day. Do not sit, drive, or stand in one place for more than 30  minutes at a time. Sitting or standing for long periods of time can put stress on your back. Do not drive or use heavy machinery while taking prescription pain medicine. Use proper lifting techniques. When you bend and lift, use positions that put less stress on your back: [place] your knees. Keep the load close to your body. Avoid twisting. Exercise regularly as told by your health care provider. Exercising helps your back heal faster and helps prevent back injuries by keeping muscles strong and flexible. Work with a physical therapist to make a safe exercise program, as recommended by your health care provider. Do any exercises as told by your physical therapist. Lifestyle Maintain a healthy weight. Extra weight puts stress on your back and makes it difficult to have good posture. Avoid activities or situations that make you feel anxious or stressed. Stress and anxiety increase muscle tension and can make back pain worse. Learn ways to manage anxiety and stress, such as through exercise. General instructions Sleep on a firm mattress in a comfortable position. Try lying on your side with your knees slightly bent. If you lie on your back, put a pillow under your knees. Keep your head and neck in a straight line with your spine (neutral position) when using electronic equipment like smartphones or pads. To do this: Raise your smartphone or pad to look at it instead of bending your head or neck to look down. Put the smartphone or pad at the level of your face while looking at the screen. Follow your treatment plan as told by your health care provider. This may include: Cognitive or behavioral therapy. Acupuncture or massage therapy. Meditation or yoga. Contact a health care provider if: You have pain that is not relieved with rest or medicine. You have increasing pain going down into your legs or buttocks. Your pain does not improve after 2 weeks. You have pain at night. You lose weight without  trying. You have a fever or chills. You develop nausea or vomiting. You develop abdominal pain. Get help right away if: You develop new bowel or bladder control problems. You have unusual weakness or numbness in your arms or legs. You feel faint. These symptoms may represent a serious problem that is an emergency. Do not wait to see if the symptoms will go away. Get medical help right away. Call your local emergency services (911 in the [place] Do not drive yourself to the hospital.  Summary Acute back pain is sudden and usually short-lived. Use proper lifting techniques. When you bend and lift, use positions that put less stress on your back. Take over-the-counter and prescription medicines only as told by your health care provider, and apply heat or ice as told. This information is not intended to replace advice given to you by your health care provider. Make sure you discuss any questions you have with your health care provider. Document Revised: 04/08/2020 Document Reviewed: 04/08/2020 Elsevier Patient Education  [place], Adult Ticks are insects that draw blood for food. Most ticks live in shrubs and grassy and wooded areas. They climb onto people and animals that brush against the leaves and grasses that they rest on. Then they bite, attaching themselves to the skin. Most ticks are harmless, but some ticks may carry germs that can spread to a person through a bite and cause a disease. To reduce your risk of getting a disease from a tick bite, make sure you: Take steps to prevent tick bites. Check for ticks after being outdoors where ticks live. Watch for symptoms of disease if a tick attached to you or if you suspect a tick bite. How can I prevent tick bites? Take these steps to help prevent tick bites when you go outdoors in an area where ticks live: Use insect repellent Use insect repellent that has DEET (20% or higher), picaridin, or IR3535 in it.  Follow the instructions on the label. Use these products on: Bare skin. The top of your boots. Your pant legs. Your sleeve cuffs. For insect repellent that contains permethrin, follow the instructions on the label. Use these products on: Clothing. Boots. Outdoor gear. Tents. When you are outside Wear protective clothing. Long sleeves and long pants offer the best protection from ticks. Wear light-colored clothing so you can see ticks more easily. Tuck your pant legs into your socks. If you go walking on a trail, stay in the middle of the trail so your skin, hair, and clothing do not touch the bushes. Avoid walking through areas with long grass. Check for ticks on your clothing, hair, and skin often while you are outside, and check again before you go inside. Make sure to check the scalp, neck, armpits, waist, groin, and joint areas. These are the spots where ticks attach themselves most often. When you go indoors Check your clothing for ticks. Tumble dry clothes in a dryer on high heat for at least 10 minutes. If clothes are damp, additional time may be needed. If clothes require washing, use hot water. Examine gear and pets. Shower soon after being outdoors. Check your body for ticks. Conduct a full body check using a mirror. What is the proper way to remove a tick? If you find a tick on your body, remove it as soon as possible. Removing a tick sooner can prevent germs from passing to your body. Do not remove the tick with your bare fingers. To remove a tick that is crawling on your skin but has not bitten, use either of these methods: Go outdoors and brush the tick off. Remove the tick with tape or a lint roller. To remove a tick that is attached to your skin: Wash your hands. If you have latex gloves, put them on. Use fine-tipped tweezers, curved forceps, or a tick-removal tool to gently grasp the tick as close to your skin and the tick's head as possible. Gently pull with a steady,  upward, even  pressure until the tick lets go. When removing the tick: Take care to keep the tick's head attached to its body. Do not twist or jerk the tick. This can make the tick's head or mouth parts break off and remain in the skin. Do not squeeze or crush the tick's body. This could force disease-carrying fluids from the tick into your body. Do not try to remove a tick with heat, alcohol, petroleum jelly, or fingernail polish. Using these methods can cause the tick to salivate and regurgitate into your bloodstream, increasing your risk of getting a disease. What should I do after removing a tick? Dispose of the tick. Do not crush a tick with your fingers. Clean the bite area and your hands with soap and water, rubbing alcohol, or an iodine scrub. If an antiseptic cream or ointment is available, apply a small amount to the bite site. Wash and disinfect any instruments that you used to remove the tick. How should I dispose of a tick? To dispose of a live tick, use one of these methods: Place it in rubbing alcohol. Place it in a sealed bag or container. Wrap it tightly in tape. Flush it down the toilet. Contact a health care provider if: You have symptoms of a disease after a tick bite. Symptoms of a tick-borne disease can occur from moments after the tick bites to 30 days after a tick is removed. Symptoms include: Fever or chills. Any of these signs in the bite area: A red rash that makes a circle (bull's-eye rash) in the bite area. Redness and swelling. Headache. Muscle, joint, or bone pain. Abnormal tiredness. Numbness in your legs or difficulty walking or moving your legs. Tender, swollen lymph glands. A part of a tick breaks off and gets stuck in your skin. Get help right away if: You are not able to remove a tick. You experience muscle weakness or paralysis. Your symptoms get worse or you experience new symptoms. You find an engorged tick on your skin and you are in an area  where disease from ticks is a high risk. Summary Ticks may carry germs that can spread to a person through a bite and cause a disease. Wear protective clothing and use insect repellent to prevent tick bites. Follow the instructions on the label. If you find a tick on your body, remove it as soon as possible. If the tick is attached, do not try to remove with heat, alcohol, petroleum jelly, or fingernail polish. Remove the attached tick using fine-tipped tweezers, curved forceps, or a tick-removal tool. Gently pull with steady, upward, even pressure until the tick lets go. Do not twist or jerk the tick. Do not squeeze or crush the tick's body. If you have symptoms of a disease after being bitten by a tick, contact a health care provider. This information is not intended to replace advice given to you by your health care provider. Make sure you discuss any questions you have with your health care provider. Document Revised: 01/12/2019 Document Reviewed: 01/12/2019 Elsevier Patient Education  [place].

## 2021-09-02 ENCOUNTER — Other Ambulatory Visit: Payer: Self-pay | Admitting: Family Medicine

## 2021-09-02 DIAGNOSIS — F439 Reaction to severe stress, unspecified: Secondary | ICD-10-CM

## 2021-09-05 ENCOUNTER — Encounter: Payer: Self-pay | Admitting: Family Medicine

## 2021-09-06 ENCOUNTER — Other Ambulatory Visit: Payer: Self-pay | Admitting: Family

## 2021-09-06 DIAGNOSIS — E785 Hyperlipidemia, unspecified: Secondary | ICD-10-CM

## 2021-09-06 MED ORDER — ATORVASTATIN CALCIUM 10 MG PO TABS: 10.0000 mg | ORAL_TABLET | Freq: Every day | ORAL | 3 refills | Status: AC

## 2021-09-11 MED ORDER — ALPRAZOLAM 0.5 MG PO TABS: 0.5000 mg | ORAL_TABLET | Freq: Two times a day (BID) | ORAL | 0 refills | Status: AC | PRN

## 2021-11-10 DIAGNOSIS — R350 Frequency of micturition: Secondary | ICD-10-CM | POA: Diagnosis not present

## 2021-11-10 DIAGNOSIS — N401 Enlarged prostate with lower urinary tract symptoms: Secondary | ICD-10-CM | POA: Diagnosis not present

## 2021-11-23 DIAGNOSIS — R350 Frequency of micturition: Secondary | ICD-10-CM | POA: Diagnosis not present

## 2021-11-23 DIAGNOSIS — C61 Malignant neoplasm of prostate: Secondary | ICD-10-CM | POA: Diagnosis not present

## 2021-11-23 DIAGNOSIS — N401 Enlarged prostate with lower urinary tract symptoms: Secondary | ICD-10-CM | POA: Diagnosis not present

## 2021-11-30 ENCOUNTER — Telehealth: Payer: Self-pay

## 2021-11-30 ENCOUNTER — Encounter: Payer: Self-pay | Admitting: Family Medicine

## 2021-11-30 NOTE — Telephone Encounter (Signed)
Please see phone note 11/30/21 before any response to the patient

## 2021-11-30 NOTE — Telephone Encounter (Signed)
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   Juliann Pulse has placed the dismissal letter on Dr Carlota Raspberry desk for review.

## 2021-12-01 NOTE — Telephone Encounter (Signed)
I am very sorry to see this. I reviewed his physical in June, and appears in addition to his routine physical we discussed a tick bite, umbilical hernia and referral was placed to surgery, and discussed mechanical low back pain.  I suspect the additional charge was due to the office visit charge for these additional issues in addition to physical.  I can understand his concern for having to pay additional $150 but unfortunately with his treatment of staff based on last note and phone call, will need to be dismissed.  Again, I am sorry to see this as I have enjoyed caring for Matthew Henson. I will review the paperwork when I am back in town.

## 2021-12-13 ENCOUNTER — Encounter: Payer: Self-pay | Admitting: Family Medicine

## 2022-01-11 DIAGNOSIS — K429 Umbilical hernia without obstruction or gangrene: Secondary | ICD-10-CM | POA: Diagnosis not present

## 2022-03-21 ENCOUNTER — Other Ambulatory Visit: Payer: Self-pay | Admitting: Family Medicine

## 2022-03-21 DIAGNOSIS — F439 Reaction to severe stress, unspecified: Secondary | ICD-10-CM

## 2022-03-22 NOTE — Telephone Encounter (Signed)
Patient is requesting a refill of the following medications: Requested Prescriptions   Pending Prescriptions Disp Refills   ALPRAZolam (XANAX) 0.5 MG tablet [Pharmacy Med Name: ALPRAZOLAM 0.5 MG TABLET] 30 tablet     Sig: TAKE 1 TABLET BY MOUTH TWICE A DAY AS NEEDED FOR ANXIETY OT STRESS    Date of patient request: 03/22/22 Last office visit: 07/20/21 Date of last refill: 09/11/21 Last refill amount: 30

## 2022-04-11 DIAGNOSIS — D485 Neoplasm of uncertain behavior of skin: Secondary | ICD-10-CM | POA: Diagnosis not present

## 2022-04-11 DIAGNOSIS — C44319 Basal cell carcinoma of skin of other parts of face: Secondary | ICD-10-CM | POA: Diagnosis not present

## 2022-05-28 DIAGNOSIS — N401 Enlarged prostate with lower urinary tract symptoms: Secondary | ICD-10-CM | POA: Diagnosis not present

## 2022-05-28 DIAGNOSIS — R350 Frequency of micturition: Secondary | ICD-10-CM | POA: Diagnosis not present

## 2022-06-04 DIAGNOSIS — C61 Malignant neoplasm of prostate: Secondary | ICD-10-CM | POA: Diagnosis not present

## 2022-06-04 DIAGNOSIS — N401 Enlarged prostate with lower urinary tract symptoms: Secondary | ICD-10-CM | POA: Diagnosis not present

## 2022-06-04 DIAGNOSIS — R350 Frequency of micturition: Secondary | ICD-10-CM | POA: Diagnosis not present

## 2022-06-05 ENCOUNTER — Other Ambulatory Visit: Payer: Self-pay | Admitting: Urology

## 2022-06-05 DIAGNOSIS — C44311 Basal cell carcinoma of skin of nose: Secondary | ICD-10-CM | POA: Diagnosis not present

## 2022-06-05 DIAGNOSIS — C61 Malignant neoplasm of prostate: Secondary | ICD-10-CM

## 2022-07-30 ENCOUNTER — Ambulatory Visit
Admission: RE | Admit: 2022-07-30 | Discharge: 2022-07-30 | Disposition: A | Discharge status: Home / Self Care | Payer: BC Managed Care – PPO | Source: Ambulatory Visit | Attending: Urology | Admitting: Urology

## 2022-07-30 DIAGNOSIS — R972 Elevated prostate specific antigen [PSA]: Secondary | ICD-10-CM | POA: Diagnosis not present

## 2022-07-30 DIAGNOSIS — N429 Disorder of prostate, unspecified: Secondary | ICD-10-CM | POA: Diagnosis not present

## 2022-07-30 DIAGNOSIS — C61 Malignant neoplasm of prostate: Secondary | ICD-10-CM

## 2022-07-30 MED ORDER — GADOPICLENOL 0.5 MMOL/ML IV SOLN
10.0000 mL | Freq: Once | INTRAVENOUS | Status: AC | PRN
  Administered 2022-07-30: 10 mL via INTRAVENOUS

## 2022-08-28 DIAGNOSIS — F418 Other specified anxiety disorders: Secondary | ICD-10-CM | POA: Diagnosis not present

## 2022-08-28 DIAGNOSIS — Z0000 Encounter for general adult medical examination without abnormal findings: Secondary | ICD-10-CM | POA: Diagnosis not present

## 2022-08-28 DIAGNOSIS — F3342 Major depressive disorder, recurrent, in full remission: Secondary | ICD-10-CM | POA: Diagnosis not present

## 2022-08-28 DIAGNOSIS — E782 Mixed hyperlipidemia: Secondary | ICD-10-CM | POA: Diagnosis not present

## 2022-08-28 DIAGNOSIS — Z131 Encounter for screening for diabetes mellitus: Secondary | ICD-10-CM | POA: Diagnosis not present

## 2022-08-28 DIAGNOSIS — Z79899 Other long term (current) drug therapy: Secondary | ICD-10-CM | POA: Diagnosis not present

## 2022-09-14 DIAGNOSIS — Z85828 Personal history of other malignant neoplasm of skin: Secondary | ICD-10-CM | POA: Diagnosis not present

## 2022-09-14 DIAGNOSIS — L570 Actinic keratosis: Secondary | ICD-10-CM | POA: Diagnosis not present

## 2022-09-14 DIAGNOSIS — D225 Melanocytic nevi of trunk: Secondary | ICD-10-CM | POA: Diagnosis not present

## 2022-09-14 DIAGNOSIS — L821 Other seborrheic keratosis: Secondary | ICD-10-CM | POA: Diagnosis not present

## 2022-09-14 DIAGNOSIS — L578 Other skin changes due to chronic exposure to nonionizing radiation: Secondary | ICD-10-CM | POA: Diagnosis not present

## 2022-09-23 ENCOUNTER — Other Ambulatory Visit: Payer: Self-pay | Admitting: Family

## 2022-09-23 DIAGNOSIS — E785 Hyperlipidemia, unspecified: Secondary | ICD-10-CM

## 2022-09-25 ENCOUNTER — Encounter (INDEPENDENT_AMBULATORY_CARE_PROVIDER_SITE_OTHER): Payer: Self-pay

## 2022-09-26 ENCOUNTER — Other Ambulatory Visit: Payer: Self-pay | Admitting: Family Medicine

## 2022-09-26 DIAGNOSIS — F439 Reaction to severe stress, unspecified: Secondary | ICD-10-CM

## 2022-09-26 DIAGNOSIS — F418 Other specified anxiety disorders: Secondary | ICD-10-CM

## 2022-11-26 DIAGNOSIS — C61 Malignant neoplasm of prostate: Secondary | ICD-10-CM | POA: Diagnosis not present

## 2022-12-03 DIAGNOSIS — C61 Malignant neoplasm of prostate: Secondary | ICD-10-CM | POA: Diagnosis not present

## 2022-12-03 DIAGNOSIS — N401 Enlarged prostate with lower urinary tract symptoms: Secondary | ICD-10-CM | POA: Diagnosis not present

## 2022-12-03 DIAGNOSIS — R350 Frequency of micturition: Secondary | ICD-10-CM | POA: Diagnosis not present
